# Patient Record
Sex: Female | Born: 1965 | Race: White | Hispanic: No | Marital: Married | Smoking: Former smoker
Health system: Southern US, Community
[De-identification: ages and names within clinical notes are randomized; demographics above are authoritative.]

---

## 2016-05-19 DIAGNOSIS — M9901 Segmental and somatic dysfunction of cervical region: Secondary | ICD-10-CM | POA: Diagnosis not present

## 2016-05-19 DIAGNOSIS — M9902 Segmental and somatic dysfunction of thoracic region: Secondary | ICD-10-CM | POA: Diagnosis not present

## 2016-05-19 DIAGNOSIS — M5033 Other cervical disc degeneration, cervicothoracic region: Secondary | ICD-10-CM | POA: Diagnosis not present

## 2016-05-19 DIAGNOSIS — M531 Cervicobrachial syndrome: Secondary | ICD-10-CM | POA: Diagnosis not present

## 2016-05-20 DIAGNOSIS — M9901 Segmental and somatic dysfunction of cervical region: Secondary | ICD-10-CM | POA: Diagnosis not present

## 2016-05-20 DIAGNOSIS — M531 Cervicobrachial syndrome: Secondary | ICD-10-CM | POA: Diagnosis not present

## 2016-05-20 DIAGNOSIS — M9902 Segmental and somatic dysfunction of thoracic region: Secondary | ICD-10-CM | POA: Diagnosis not present

## 2016-05-20 DIAGNOSIS — M5033 Other cervical disc degeneration, cervicothoracic region: Secondary | ICD-10-CM | POA: Diagnosis not present

## 2016-05-23 DIAGNOSIS — M531 Cervicobrachial syndrome: Secondary | ICD-10-CM | POA: Diagnosis not present

## 2016-05-23 DIAGNOSIS — M9902 Segmental and somatic dysfunction of thoracic region: Secondary | ICD-10-CM | POA: Diagnosis not present

## 2016-05-23 DIAGNOSIS — M5033 Other cervical disc degeneration, cervicothoracic region: Secondary | ICD-10-CM | POA: Diagnosis not present

## 2016-05-23 DIAGNOSIS — M9901 Segmental and somatic dysfunction of cervical region: Secondary | ICD-10-CM | POA: Diagnosis not present

## 2016-05-25 DIAGNOSIS — M9902 Segmental and somatic dysfunction of thoracic region: Secondary | ICD-10-CM | POA: Diagnosis not present

## 2016-05-25 DIAGNOSIS — M5033 Other cervical disc degeneration, cervicothoracic region: Secondary | ICD-10-CM | POA: Diagnosis not present

## 2016-05-25 DIAGNOSIS — M531 Cervicobrachial syndrome: Secondary | ICD-10-CM | POA: Diagnosis not present

## 2016-05-25 DIAGNOSIS — M9901 Segmental and somatic dysfunction of cervical region: Secondary | ICD-10-CM | POA: Diagnosis not present

## 2016-05-26 DIAGNOSIS — M9902 Segmental and somatic dysfunction of thoracic region: Secondary | ICD-10-CM | POA: Diagnosis not present

## 2016-05-26 DIAGNOSIS — M5033 Other cervical disc degeneration, cervicothoracic region: Secondary | ICD-10-CM | POA: Diagnosis not present

## 2016-05-26 DIAGNOSIS — M531 Cervicobrachial syndrome: Secondary | ICD-10-CM | POA: Diagnosis not present

## 2016-05-26 DIAGNOSIS — M9901 Segmental and somatic dysfunction of cervical region: Secondary | ICD-10-CM | POA: Diagnosis not present

## 2016-05-30 DIAGNOSIS — M5033 Other cervical disc degeneration, cervicothoracic region: Secondary | ICD-10-CM | POA: Diagnosis not present

## 2016-05-30 DIAGNOSIS — M531 Cervicobrachial syndrome: Secondary | ICD-10-CM | POA: Diagnosis not present

## 2016-05-30 DIAGNOSIS — M9902 Segmental and somatic dysfunction of thoracic region: Secondary | ICD-10-CM | POA: Diagnosis not present

## 2016-05-30 DIAGNOSIS — M9901 Segmental and somatic dysfunction of cervical region: Secondary | ICD-10-CM | POA: Diagnosis not present

## 2016-08-23 ENCOUNTER — Other Ambulatory Visit: Payer: Self-pay | Admitting: Internal Medicine

## 2016-08-23 DIAGNOSIS — E559 Vitamin D deficiency, unspecified: Secondary | ICD-10-CM | POA: Diagnosis not present

## 2016-08-23 DIAGNOSIS — Z23 Encounter for immunization: Secondary | ICD-10-CM | POA: Diagnosis not present

## 2016-08-23 DIAGNOSIS — E7800 Pure hypercholesterolemia, unspecified: Secondary | ICD-10-CM | POA: Diagnosis not present

## 2016-08-23 DIAGNOSIS — M5030 Other cervical disc degeneration, unspecified cervical region: Secondary | ICD-10-CM | POA: Diagnosis not present

## 2016-08-23 DIAGNOSIS — Z119 Encounter for screening for infectious and parasitic diseases, unspecified: Secondary | ICD-10-CM | POA: Diagnosis not present

## 2016-08-26 ENCOUNTER — Other Ambulatory Visit: Payer: Self-pay | Admitting: Internal Medicine

## 2016-08-26 DIAGNOSIS — Z1231 Encounter for screening mammogram for malignant neoplasm of breast: Secondary | ICD-10-CM

## 2016-09-01 ENCOUNTER — Ambulatory Visit
Admission: RE | Admit: 2016-09-01 | Discharge: 2016-09-01 | Disposition: A | Discharge status: Home / Self Care | Payer: BLUE CROSS/BLUE SHIELD | Source: Ambulatory Visit | Attending: Internal Medicine | Admitting: Internal Medicine

## 2016-09-01 DIAGNOSIS — Z1231 Encounter for screening mammogram for malignant neoplasm of breast: Secondary | ICD-10-CM

## 2016-09-19 DIAGNOSIS — E559 Vitamin D deficiency, unspecified: Secondary | ICD-10-CM | POA: Diagnosis not present

## 2016-09-19 DIAGNOSIS — E7800 Pure hypercholesterolemia, unspecified: Secondary | ICD-10-CM | POA: Diagnosis not present

## 2016-09-19 DIAGNOSIS — N6011 Diffuse cystic mastopathy of right breast: Secondary | ICD-10-CM | POA: Diagnosis not present

## 2016-09-19 DIAGNOSIS — Z0001 Encounter for general adult medical examination with abnormal findings: Secondary | ICD-10-CM | POA: Diagnosis not present

## 2016-09-20 ENCOUNTER — Other Ambulatory Visit: Payer: Self-pay | Admitting: Obstetrics & Gynecology

## 2016-09-20 ENCOUNTER — Other Ambulatory Visit (HOSPITAL_COMMUNITY)
Admission: RE | Admit: 2016-09-20 | Discharge: 2016-09-20 | Disposition: A | Discharge status: Home / Self Care | Payer: BLUE CROSS/BLUE SHIELD | Source: Ambulatory Visit | Attending: Obstetrics & Gynecology | Admitting: Obstetrics & Gynecology

## 2016-09-20 DIAGNOSIS — Z01419 Encounter for gynecological examination (general) (routine) without abnormal findings: Secondary | ICD-10-CM | POA: Diagnosis not present

## 2016-09-20 DIAGNOSIS — Z1151 Encounter for screening for human papillomavirus (HPV): Secondary | ICD-10-CM | POA: Diagnosis not present

## 2016-09-23 LAB — CYTOLOGY - PAP
DIAGNOSIS: NEGATIVE
HPV: NOT DETECTED

## 2016-12-06 DIAGNOSIS — M545 Low back pain: Secondary | ICD-10-CM | POA: Diagnosis not present

## 2016-12-12 DIAGNOSIS — M25552 Pain in left hip: Secondary | ICD-10-CM | POA: Diagnosis not present

## 2016-12-12 DIAGNOSIS — M545 Low back pain: Secondary | ICD-10-CM | POA: Diagnosis not present

## 2016-12-26 DIAGNOSIS — M545 Low back pain: Secondary | ICD-10-CM | POA: Diagnosis not present

## 2016-12-26 DIAGNOSIS — M79601 Pain in right arm: Secondary | ICD-10-CM | POA: Diagnosis not present

## 2017-01-03 DIAGNOSIS — M545 Low back pain: Secondary | ICD-10-CM | POA: Diagnosis not present

## 2017-01-03 DIAGNOSIS — M79601 Pain in right arm: Secondary | ICD-10-CM | POA: Diagnosis not present

## 2017-01-24 DIAGNOSIS — M79601 Pain in right arm: Secondary | ICD-10-CM | POA: Diagnosis not present

## 2017-01-24 DIAGNOSIS — M545 Low back pain: Secondary | ICD-10-CM | POA: Diagnosis not present

## 2017-02-02 DIAGNOSIS — M79601 Pain in right arm: Secondary | ICD-10-CM | POA: Diagnosis not present

## 2017-02-02 DIAGNOSIS — M545 Low back pain: Secondary | ICD-10-CM | POA: Diagnosis not present

## 2017-02-13 DIAGNOSIS — M79601 Pain in right arm: Secondary | ICD-10-CM | POA: Diagnosis not present

## 2017-02-13 DIAGNOSIS — M545 Low back pain: Secondary | ICD-10-CM | POA: Diagnosis not present

## 2017-02-27 DIAGNOSIS — M79601 Pain in right arm: Secondary | ICD-10-CM | POA: Diagnosis not present

## 2017-02-27 DIAGNOSIS — M545 Low back pain: Secondary | ICD-10-CM | POA: Diagnosis not present

## 2017-03-13 DIAGNOSIS — M79601 Pain in right arm: Secondary | ICD-10-CM | POA: Diagnosis not present

## 2017-03-13 DIAGNOSIS — M545 Low back pain: Secondary | ICD-10-CM | POA: Diagnosis not present

## 2017-03-21 DIAGNOSIS — M79601 Pain in right arm: Secondary | ICD-10-CM | POA: Diagnosis not present

## 2017-03-21 DIAGNOSIS — M545 Low back pain: Secondary | ICD-10-CM | POA: Diagnosis not present

## 2017-03-27 DIAGNOSIS — M545 Low back pain: Secondary | ICD-10-CM | POA: Diagnosis not present

## 2017-03-27 DIAGNOSIS — M79601 Pain in right arm: Secondary | ICD-10-CM | POA: Diagnosis not present

## 2017-04-11 DIAGNOSIS — M545 Low back pain: Secondary | ICD-10-CM | POA: Diagnosis not present

## 2017-04-11 DIAGNOSIS — M79601 Pain in right arm: Secondary | ICD-10-CM | POA: Diagnosis not present

## 2017-08-13 DIAGNOSIS — J3089 Other allergic rhinitis: Secondary | ICD-10-CM | POA: Diagnosis not present

## 2017-08-13 DIAGNOSIS — R0982 Postnasal drip: Secondary | ICD-10-CM | POA: Diagnosis not present

## 2017-08-13 DIAGNOSIS — J069 Acute upper respiratory infection, unspecified: Secondary | ICD-10-CM | POA: Diagnosis not present

## 2017-08-13 DIAGNOSIS — J019 Acute sinusitis, unspecified: Secondary | ICD-10-CM | POA: Diagnosis not present

## 2017-09-21 ENCOUNTER — Other Ambulatory Visit: Payer: Self-pay | Admitting: Internal Medicine

## 2017-09-21 DIAGNOSIS — R1013 Epigastric pain: Secondary | ICD-10-CM

## 2017-09-21 DIAGNOSIS — Z01419 Encounter for gynecological examination (general) (routine) without abnormal findings: Secondary | ICD-10-CM | POA: Diagnosis not present

## 2017-09-21 DIAGNOSIS — Z119 Encounter for screening for infectious and parasitic diseases, unspecified: Secondary | ICD-10-CM | POA: Diagnosis not present

## 2017-09-21 DIAGNOSIS — Z1231 Encounter for screening mammogram for malignant neoplasm of breast: Secondary | ICD-10-CM

## 2017-09-27 ENCOUNTER — Ambulatory Visit
Admission: RE | Admit: 2017-09-27 | Discharge: 2017-09-27 | Disposition: A | Discharge status: Home / Self Care | Payer: BLUE CROSS/BLUE SHIELD | Source: Ambulatory Visit | Attending: Internal Medicine | Admitting: Internal Medicine

## 2017-09-27 DIAGNOSIS — R1011 Right upper quadrant pain: Secondary | ICD-10-CM | POA: Diagnosis not present

## 2017-09-27 DIAGNOSIS — R1013 Epigastric pain: Secondary | ICD-10-CM

## 2017-10-16 ENCOUNTER — Ambulatory Visit
Admission: RE | Admit: 2017-10-16 | Discharge: 2017-10-16 | Disposition: A | Discharge status: Home / Self Care | Payer: BLUE CROSS/BLUE SHIELD | Source: Ambulatory Visit | Attending: Internal Medicine | Admitting: Internal Medicine

## 2017-10-16 DIAGNOSIS — Z1231 Encounter for screening mammogram for malignant neoplasm of breast: Secondary | ICD-10-CM

## 2017-12-01 DIAGNOSIS — Z23 Encounter for immunization: Secondary | ICD-10-CM | POA: Diagnosis not present

## 2018-06-24 ENCOUNTER — Encounter (HOSPITAL_BASED_OUTPATIENT_CLINIC_OR_DEPARTMENT_OTHER): Payer: Self-pay | Admitting: Emergency Medicine

## 2018-06-24 ENCOUNTER — Other Ambulatory Visit: Payer: Self-pay

## 2018-06-24 ENCOUNTER — Emergency Department (HOSPITAL_BASED_OUTPATIENT_CLINIC_OR_DEPARTMENT_OTHER)
Admission: EM | Admit: 2018-06-24 | Discharge: 2018-06-24 | Disposition: A | Discharge status: Home / Self Care | Payer: BLUE CROSS/BLUE SHIELD | Attending: Emergency Medicine | Admitting: Emergency Medicine

## 2018-06-24 ENCOUNTER — Emergency Department (HOSPITAL_BASED_OUTPATIENT_CLINIC_OR_DEPARTMENT_OTHER): Payer: BLUE CROSS/BLUE SHIELD

## 2018-06-24 DIAGNOSIS — Y929 Unspecified place or not applicable: Secondary | ICD-10-CM | POA: Diagnosis not present

## 2018-06-24 DIAGNOSIS — M542 Cervicalgia: Secondary | ICD-10-CM | POA: Insufficient documentation

## 2018-06-24 DIAGNOSIS — S199XXA Unspecified injury of neck, initial encounter: Secondary | ICD-10-CM | POA: Diagnosis not present

## 2018-06-24 DIAGNOSIS — R51 Headache: Secondary | ICD-10-CM

## 2018-06-24 DIAGNOSIS — W19XXXA Unspecified fall, initial encounter: Secondary | ICD-10-CM

## 2018-06-24 DIAGNOSIS — S01511A Laceration without foreign body of lip, initial encounter: Secondary | ICD-10-CM | POA: Diagnosis not present

## 2018-06-24 DIAGNOSIS — Y93E1 Activity, personal bathing and showering: Secondary | ICD-10-CM | POA: Insufficient documentation

## 2018-06-24 DIAGNOSIS — S0093XA Contusion of unspecified part of head, initial encounter: Secondary | ICD-10-CM | POA: Insufficient documentation

## 2018-06-24 DIAGNOSIS — Y999 Unspecified external cause status: Secondary | ICD-10-CM | POA: Insufficient documentation

## 2018-06-24 DIAGNOSIS — S0993XA Unspecified injury of face, initial encounter: Secondary | ICD-10-CM | POA: Diagnosis not present

## 2018-06-24 DIAGNOSIS — S0990XA Unspecified injury of head, initial encounter: Secondary | ICD-10-CM | POA: Diagnosis not present

## 2018-06-24 DIAGNOSIS — R220 Localized swelling, mass and lump, head: Secondary | ICD-10-CM | POA: Diagnosis not present

## 2018-06-24 DIAGNOSIS — W182XXA Fall in (into) shower or empty bathtub, initial encounter: Secondary | ICD-10-CM | POA: Insufficient documentation

## 2018-06-24 DIAGNOSIS — R519 Headache, unspecified: Secondary | ICD-10-CM

## 2018-06-24 MED ORDER — DIPHENHYDRAMINE HCL 50 MG/ML IJ SOLN
25.0000 mg | Freq: Once | INTRAMUSCULAR | Status: AC
  Administered 2018-06-24: 25 mg via INTRAVENOUS
  Filled 2018-06-24: qty 1

## 2018-06-24 MED ORDER — PROCHLORPERAZINE EDISYLATE 10 MG/2ML IJ SOLN
10.0000 mg | Freq: Once | INTRAMUSCULAR | Status: AC
  Administered 2018-06-24: 10 mg via INTRAVENOUS
  Filled 2018-06-24: qty 2

## 2018-06-24 MED ORDER — IBUPROFEN 800 MG PO TABS
800.0000 mg | ORAL_TABLET | Freq: Once | ORAL | Status: DC
  Filled 2018-06-24: qty 1

## 2018-06-24 MED ORDER — KETOROLAC TROMETHAMINE 30 MG/ML IJ SOLN
30.0000 mg | Freq: Once | INTRAMUSCULAR | Status: AC
  Administered 2018-06-24: 30 mg via INTRAVENOUS
  Filled 2018-06-24: qty 1

## 2018-06-24 MED ORDER — PROCHLORPERAZINE MALEATE 10 MG PO TABS: 10.0000 mg | ORAL_TABLET | Freq: Two times a day (BID) | ORAL | 0 refills | Status: DC | PRN

## 2018-06-24 NOTE — Discharge Instructions (Signed)
Your imaging today was overall reassuring.  You had some facial swelling where you hit your face.  We did not find evidence of fracture or dislocation or intracranial hemorrhage.  We suspect he may have a mild concussion.  Please take the medicine to help with your nausea and stay hydrated.  Please rest.  If any symptoms change or worsen, please return to the nearest emergency department.

## 2018-06-24 NOTE — ED Triage Notes (Addendum)
Larey SeatFell getting out of the  shower and hit left side of face on floor. No LOC, swelling to left side of face

## 2018-06-24 NOTE — ED Provider Notes (Signed)
[facility] EMERGENCY DEPARTMENT Provider Note   CSN: [id number] Arrival date & time: 06/24/18  0906     History   Chief Complaint Chief Complaint  Patient presents with  . Fall    HPI Rami Waddle is a 52 y.o. female.  The history is provided by the patient and the spouse.  Fall  This is a new problem. The current episode started 1 to 2 hours ago. The problem occurs constantly. The problem has not changed since onset.Associated symptoms include headaches. Pertinent negatives include no chest pain, no abdominal pain and no shortness of breath. Nothing aggravates the symptoms. Nothing relieves the symptoms. She has tried a cold compress for the symptoms. The treatment provided mild relief.    History reviewed. No pertinent past medical history.  There are no active problems to display for this patient.   History reviewed. No pertinent surgical history.   OB History   None      Home Medications    Prior to Admission medications   Not on File    Family History Family History  Problem Relation Age of Onset  . Breast cancer Neg Hx     Social History Social History   Tobacco Use  . Smoking status: Never Smoker  . Smokeless tobacco: Never Used  Substance Use Topics  . Alcohol use: Yes    Comment: social  . Drug use: Never     Allergies   Augmentin [amoxicillin-pot clavulanate]   Review of Systems Review of Systems  Constitutional: Negative for activity change, chills, diaphoresis, fatigue and fever.  HENT: Negative for congestion and rhinorrhea.   Eyes: Negative for visual disturbance.  Respiratory: Negative for cough, chest tightness, shortness of breath and stridor.   Cardiovascular: Negative for chest pain, palpitations and leg swelling.  Gastrointestinal: Negative for abdominal distention, abdominal pain, constipation, diarrhea, nausea and vomiting.  Genitourinary: Negative for difficulty urinating, dysuria, flank pain, frequency,  hematuria, menstrual problem, pelvic pain, vaginal bleeding and vaginal discharge.  Musculoskeletal: Positive for neck pain. Negative for back pain and neck stiffness.  Skin: Negative for rash and wound.  Neurological: Positive for headaches. Negative for dizziness, tremors, seizures, facial asymmetry, speech difficulty, weakness, light-headedness and numbness.  Psychiatric/Behavioral: Negative for agitation and confusion.  All other systems reviewed and are negative.    Physical Exam Updated Vital Signs BP 125/82 (BP Location: Right Arm)   Pulse 82   Temp 98 F (36.7 C) (Oral)   Resp 16   Ht 5\' 8"  (1.727 m)   Wt 72.6 kg (160 lb)   LMP 05/31/2018   SpO2 99%   BMI 24.33 kg/m   Physical Exam  Constitutional: She is oriented to person, place, and time. She appears well-developed and well-nourished. No distress.  HENT:  Head: Normocephalic. Head is with contusion and with laceration. Head is without abrasion.    Right Ear: External ear normal.  Left Ear: External ear normal.  Nose: Nose normal.  Mouth/Throat: Uvula is midline and oropharynx is clear and moist. She does not have dentures. No oral lesions. No trismus in the jaw. Normal dentition. No uvula swelling. No oropharyngeal exudate.    Eyes: Pupils are equal, round, and reactive to light. Conjunctivae and EOM are normal.  Neck: Normal range of motion. Neck supple. Muscular tenderness present. No spinous process tenderness present. No neck rigidity. Normal range of motion present.    Cardiovascular: Normal rate and intact distal pulses.  No murmur heard. Pulmonary/Chest: Effort normal. No stridor. No  respiratory distress. She exhibits no tenderness.  Abdominal: She exhibits no distension. There is no tenderness. There is no rebound.  Musculoskeletal: She exhibits tenderness. She exhibits no edema.  Neurological: She is alert and oriented to person, place, and time. She has normal reflexes. She is not disoriented. She  displays no tremor. No cranial nerve deficit or sensory deficit. She exhibits normal muscle tone. Coordination normal.  No focal neuro abnormalities.   Skin: Skin is warm. Capillary refill takes less than 2 seconds. No rash noted. She is not diaphoretic. No erythema.  Psychiatric: She has a normal mood and affect.  Nursing note and vitals reviewed.    ED Treatments / Results  Labs (all labs ordered are listed, but only abnormal results are displayed) Labs Reviewed - No data to display  EKG None  Radiology Ct Head Wo Contrast  Result Date: 06/24/2018 CLINICAL DATA:  52 year old female with acute head, face and neck injury following fall today. Headache, facial pain and neck pain. Initial encounter. EXAM: CT HEAD WITHOUT CONTRAST CT MAXILLOFACIAL WITHOUT CONTRAST CT CERVICAL SPINE WITHOUT CONTRAST TECHNIQUE: Multidetector CT imaging of the head, cervical spine, and maxillofacial structures were performed using the standard protocol without intravenous contrast. Multiplanar CT image reconstructions of the cervical spine and maxillofacial structures were also generated. COMPARISON:  None. FINDINGS: CT HEAD FINDINGS Brain: No evidence of acute infarction, hemorrhage, hydrocephalus, extra-axial collection or mass lesion/mass effect. Vascular: No hyperdense vessel or unexpected calcification. Skull: Normal. Negative for fracture or focal lesion. Other: None. CT MAXILLOFACIAL FINDINGS Osseous: No fracture or mandibular dislocation. No destructive process. Orbits: Negative. No traumatic or inflammatory finding. Sinuses: Clear. Soft tissues: Mild LEFT facial soft tissue swelling noted. CT CERVICAL SPINE FINDINGS Alignment: Normal. Skull base and vertebrae: No acute fracture. No primary bone lesion or focal pathologic process. Soft tissues and spinal canal: No prevertebral fluid or swelling. No visible canal hematoma. Disc levels:  Unremarkable Upper chest: Negative. Other: None IMPRESSION: 1. Unremarkable  noncontrast head and cervical spine CTs. 2. LEFT facial soft tissue swelling without acute bony abnormality. Electronically Signed   By: Harmon Pier M.D.   On: 06/24/2018 10:51   Ct Cervical Spine Wo Contrast  Result Date: 06/24/2018 CLINICAL DATA:  52 year old female with acute head, face and neck injury following fall today. Headache, facial pain and neck pain. Initial encounter. EXAM: CT HEAD WITHOUT CONTRAST CT MAXILLOFACIAL WITHOUT CONTRAST CT CERVICAL SPINE WITHOUT CONTRAST TECHNIQUE: Multidetector CT imaging of the head, cervical spine, and maxillofacial structures were performed using the standard protocol without intravenous contrast. Multiplanar CT image reconstructions of the cervical spine and maxillofacial structures were also generated. COMPARISON:  None. FINDINGS: CT HEAD FINDINGS Brain: No evidence of acute infarction, hemorrhage, hydrocephalus, extra-axial collection or mass lesion/mass effect. Vascular: No hyperdense vessel or unexpected calcification. Skull: Normal. Negative for fracture or focal lesion. Other: None. CT MAXILLOFACIAL FINDINGS Osseous: No fracture or mandibular dislocation. No destructive process. Orbits: Negative. No traumatic or inflammatory finding. Sinuses: Clear. Soft tissues: Mild LEFT facial soft tissue swelling noted. CT CERVICAL SPINE FINDINGS Alignment: Normal. Skull base and vertebrae: No acute fracture. No primary bone lesion or focal pathologic process. Soft tissues and spinal canal: No prevertebral fluid or swelling. No visible canal hematoma. Disc levels:  Unremarkable Upper chest: Negative. Other: None IMPRESSION: 1. Unremarkable noncontrast head and cervical spine CTs. 2. LEFT facial soft tissue swelling without acute bony abnormality. Electronically Signed   By: Harmon Pier M.D.   On: 06/24/2018 10:51   Ct Maxillofacial  Wo Contrast  Result Date: 06/24/2018 CLINICAL DATA:  52 year old female with acute head, face and neck injury following fall today.  Headache, facial pain and neck pain. Initial encounter. EXAM: CT HEAD WITHOUT CONTRAST CT MAXILLOFACIAL WITHOUT CONTRAST CT CERVICAL SPINE WITHOUT CONTRAST TECHNIQUE: Multidetector CT imaging of the head, cervical spine, and maxillofacial structures were performed using the standard protocol without intravenous contrast. Multiplanar CT image reconstructions of the cervical spine and maxillofacial structures were also generated. COMPARISON:  None. FINDINGS: CT HEAD FINDINGS Brain: No evidence of acute infarction, hemorrhage, hydrocephalus, extra-axial collection or mass lesion/mass effect. Vascular: No hyperdense vessel or unexpected calcification. Skull: Normal. Negative for fracture or focal lesion. Other: None. CT MAXILLOFACIAL FINDINGS Osseous: No fracture or mandibular dislocation. No destructive process. Orbits: Negative. No traumatic or inflammatory finding. Sinuses: Clear. Soft tissues: Mild LEFT facial soft tissue swelling noted. CT CERVICAL SPINE FINDINGS Alignment: Normal. Skull base and vertebrae: No acute fracture. No primary bone lesion or focal pathologic process. Soft tissues and spinal canal: No prevertebral fluid or swelling. No visible canal hematoma. Disc levels:  Unremarkable Upper chest: Negative. Other: None IMPRESSION: 1. Unremarkable noncontrast head and cervical spine CTs. 2. LEFT facial soft tissue swelling without acute bony abnormality. Electronically Signed   By: Harmon PierJeffrey  Hu M.D.   On: 06/24/2018 10:51    Procedures Procedures (including critical care time)  Medications Ordered in ED Medications  ibuprofen (ADVIL,MOTRIN) tablet 800 mg (800 mg Oral Refused 06/24/18 1132)  prochlorperazine (COMPAZINE) injection 10 mg (10 mg Intravenous Given 06/24/18 1143)  diphenhydrAMINE (BENADRYL) injection 25 mg (25 mg Intravenous Given 06/24/18 1143)  ketorolac (TORADOL) 30 MG/ML injection 30 mg (30 mg Intravenous Given 06/24/18 1143)     Initial Impression / Assessment and Plan / ED Course   I have reviewed the triage vital signs and the nursing notes.  Pertinent labs & imaging results that were available during my care of the patient were reviewed by me and considered in my medical decision making (see chart for details).     Sigmund Hazelaige Denne is a 52 y.o. female with no significant past medical history who presents for a fall with left facial injury.  Patient reports that she slipped getting out of the shower today while chasing a dog and a cat landing on her left face onto tile.  She reports no loss of consciousness but felt dazed.  She reports she was somewhat unsteady after her fall.  She reports no nausea or vomiting but reports left-sided headache and pain.  Patient also reports pain in her neck.  She denies chest pain, shortness of breath, nausea, vomiting.  She denies other complaints.  She denies any pain with eye movement or vision changes.  On exam, patient has a small abrasion/laceration to her left inner lip.  No evidence of vermilion border injury.  Wound is small and do not feel needs laceration repair.  Patient had tenderness in the left face.  No hemotympanum and no nasal septal hematoma was seen.  Normal extraocular movements and pupils reactive bilaterally.  Neck had some tenderness in the left paraspinal area but no midline tenderness.  Lungs were clear and chest was nontender.  Patient had full range of motion of upper extremities including her left shoulder which was slightly painful.  No focal tenderness.  Doubt fracture.    Patient had CT of the head, face, and neck to look for traumatic injuries.  Soft tissue injury was seen but no evidence of intracranial hemorrhage or fractures.  Patient will be given a headache cocktail to help with her symptoms and patient will likely be discharged home for outpatient management of her mild concussion and soft tissue injury.    Anticipate follow-up with PCP in several days for reassessment.  Patient reports her headache is nearly  resolved after the medicine.  Patient feels much better and feel safe going home.  Patient will follow-up with PCP and understood return precautions.  Patient can prescription for Compazine to help maintain her nausea.  Patient denied other questions or concerns and was discharged in good condition.    Final Clinical Impressions(s) / ED Diagnoses   Final diagnoses:  Fall, initial encounter  Facial pain, acute    ED Discharge Orders        Ordered    prochlorperazine (COMPAZINE) 10 MG tablet  2 times daily PRN,   Status:  Discontinued     06/24/18 1222    prochlorperazine (COMPAZINE) 10 MG tablet  2 times daily PRN     06/24/18 1223     Clinical Impression: 1. Fall, initial encounter   2. Facial pain, acute     Disposition: Discharge  Condition: Good  I have discussed the results, Dx and Tx plan with the pt(& family if present). He/she/they expressed understanding and agree(s) with the plan. Discharge instructions discussed at great length. Strict return precautions discussed and pt &/or family have verbalized understanding of the instructions. No further questions at time of discharge.    Current Discharge Medication List    START taking these medications   Details  prochlorperazine (COMPAZINE) 10 MG tablet Take 1 tablet (10 mg total) by mouth 2 (two) times daily as needed for nausea or vomiting. Qty: 10 tablet, Refills: 0        Follow Up: Kendrick Ranch, MD [address] [place] [address] [place] [address] [phone]     [facility] [place]PARTMENT [address] [id number] [address] [place] [address] [phone]       Roise Emert, Canary Brim, MD 06/24/18 Norberta Keens

## 2018-09-24 DIAGNOSIS — Z23 Encounter for immunization: Secondary | ICD-10-CM | POA: Diagnosis not present

## 2018-09-24 DIAGNOSIS — E7800 Pure hypercholesterolemia, unspecified: Secondary | ICD-10-CM | POA: Diagnosis not present

## 2018-09-24 DIAGNOSIS — Z0000 Encounter for general adult medical examination without abnormal findings: Secondary | ICD-10-CM | POA: Diagnosis not present

## 2018-09-25 ENCOUNTER — Other Ambulatory Visit: Payer: Self-pay | Admitting: Obstetrics & Gynecology

## 2018-09-25 DIAGNOSIS — Z01411 Encounter for gynecological examination (general) (routine) with abnormal findings: Secondary | ICD-10-CM | POA: Diagnosis not present

## 2018-09-25 DIAGNOSIS — N939 Abnormal uterine and vaginal bleeding, unspecified: Secondary | ICD-10-CM | POA: Diagnosis not present

## 2018-09-25 DIAGNOSIS — Z3202 Encounter for pregnancy test, result negative: Secondary | ICD-10-CM | POA: Diagnosis not present

## 2018-10-01 DIAGNOSIS — M7502 Adhesive capsulitis of left shoulder: Secondary | ICD-10-CM | POA: Diagnosis not present

## 2018-10-03 DIAGNOSIS — M7502 Adhesive capsulitis of left shoulder: Secondary | ICD-10-CM | POA: Diagnosis not present

## 2018-10-04 DIAGNOSIS — M7502 Adhesive capsulitis of left shoulder: Secondary | ICD-10-CM | POA: Diagnosis not present

## 2018-10-11 DIAGNOSIS — M7502 Adhesive capsulitis of left shoulder: Secondary | ICD-10-CM | POA: Diagnosis not present

## 2018-10-15 DIAGNOSIS — N840 Polyp of corpus uteri: Secondary | ICD-10-CM | POA: Diagnosis not present

## 2018-10-15 DIAGNOSIS — Z01818 Encounter for other preprocedural examination: Secondary | ICD-10-CM | POA: Diagnosis not present

## 2018-10-19 DIAGNOSIS — N939 Abnormal uterine and vaginal bleeding, unspecified: Secondary | ICD-10-CM | POA: Diagnosis not present

## 2018-10-19 DIAGNOSIS — Z01818 Encounter for other preprocedural examination: Secondary | ICD-10-CM | POA: Diagnosis not present

## 2018-10-19 DIAGNOSIS — N840 Polyp of corpus uteri: Secondary | ICD-10-CM | POA: Diagnosis not present

## 2018-10-19 NOTE — H&P (Signed)
52yo G0 who presents for [facility], D&C, Myosure polypectomy due to AUB and uterine polyp- 12/6. In reivew: 1) AUB: Over the past year her periods have started to become more frequent. On occasion, she will have 2 periods in one month. It will be about every 2 weeks- menses are the same- about 3-5 days of moderate bleeding using about 4 pads per day. The bleeding is sporadic and never knows which months this is going to happen and which months it does not. Today she notes that she had a "normal" period 11/11 and so far no further bleeding. No other acute changes. No vaginal discharge, itching or irritation. No pelvic or abdominal pain. Denies significant pelvic pain. Denies hot flashes or night sweats. No other acute complaints 10/29: TVUS/SHG: 8.5cm anteverted uterus- thickened endometrium. With SHG 1cm hyperechoic mass see ant wall- mid to LUS- avascular. Enodmetrial walls thin. Normal ovaries bilaterally.   Current Medications  Taking   Ibuprofen 400 MG Tablet 1 tablet with food or milk as needed Orally Three times a day, Notes: PRN   Advil Allergy Sinus(Chlorpheniramine-PSE-Ibuprofen) 2-30-200 MG Tablet 1 tablet as needed Orally every 6 hrs, Notes: PRN   Gas-X(Simethicone) 80 MG Tablet Chewable 1 tablet after meals and at bedtime as needed Orally Four times a day, Notes: PRN   Cod Liver Oil - Oil 1 tsp Orally   MVI Ritual 1 tab daily   Medication List reviewed and reconciled with the patient    Past Medical History  DJD (degenerative joint disease), cervical.   Vitamin D deficiency.   Benign paroxysmal positional vertigo, unspecified laterality Post head trauma after falling in 2014.   Fibrocystic changes of right breast.           Surgical History  No Surgical History documented.   Family History  Father: alive, heart disease, COPD,, emphysema-smoker, high cholesterol  Mother: alive, high cholesterol, asthma-cholrine posion in lab  Paternal [place] Father: deceased  Paternal Grand  Mother: deceased, heart issues in late 77's-bypass at 17  Maternal [place] Father: deceased, smoker, died of lung related issues  Maternal Grand Mother: deceased, multiple myleloma  Maternal aunt: deceased, died in 10's breast cancer  Great Aunt Breast CA GGM DM no h/o colon CA  only child.   Social History  General:  Alcohol: yes, 1-2 per week.  Children: none.  Caffeine: yes, coffee, tea, 2+ servings daily.  Tobacco use  cigarettes: Former smoker Quit in year 2014 Pack-year Hx: 22 socially about 1/2 ppd at most Tobacco history last updated 10/15/2018 Marital Status: married.  OCCUPATION: employed, Glass blower/designer.    Gyn History  Sexual activity currently sexually active.  Periods : regular.  LMP 10-08-18.  Denies H/O Birth control.  Last pap smear date 09/20/2016 Negative, HPV-.  Last mammogram date 09-2017.  Denies H/O Abnormal pap smear.  Denies H/O STD.  Menarche 53.    OB History  Never been pregnant per patient.    Allergies  Birth Control Pills: hives  Augmentin: Hives   Hospitalization/Major Diagnostic Procedure  No Hospitalization History.   Review of Systems  CONSTITUTIONAL:  no Appetite changes. no Chills. Fatigue yes. no Fever.  CARDIOLOGY:  no Chest pain.  RESPIRATORY:  no Shortness of breath. no Cough.  UROLOGY:  no Dysuria. no Urinary frequency. no Urinary incontinence. no Urinary urgency.  GASTROENTEROLOGY:  no Abdominal pain. no Change in bowel habits. no Change in bowel movements.  FEMALE REPRODUCTIVE:  no Abnormal vaginal discharge. no Breast lumps or discharge. no  Breast pain. no Hot flashes. no Sexual problems. no Vaginal irritation. no Vaginal itching.  NEUROLOGY:  no Dizziness. no Headache.  PSYCHOLOGY:  no Anxiety. no Depression.  SKIN:  no Rash. no Hives.  HEMATOLOGY/LYMPH:  no Anemia. Using Blood Thinners no.     Vital Signs  Wt 163.3, Wt change 2.3 lb, Ht 67.25, BMI 25.38, BP sitting 100/60.   Examination  General  Examination: CONSTITUTIONAL: well nourished, well developed.  SKIN: warm & dry, no rashes.  NECK: supple, normal appearance, thyroid not enlarged.  LUNGS: clear to auscultation bilaterally, no wheezes, rhonchi, rales.  HEART: no murmurs, regular rate and rhythm.  ABDOMEN: no masses palpated, soft and not tender, no rebound, no guarding.  EXTREMITIES: no edema present, no calf tenderness bilaterally.  LYMPH NODES: no inguinal adenopathy, no axillary adenopathy.  PSYCH: oriented x 3, appropriate mood and affect.      LABS: 09/24/2018 CBC with Diff    NAME VALUE REFERENCE RANGE  F WBC 5.3 4.0-11.0 (K/ul)  F RBC 4.47 4.20-5.40 (M/uL)  F HGB 13.7 12.0-16.0 (g/dL)  F HCT 40.7 37.0-47.0 (%)  F MCV 91.2 81.0-99.0 (fL)  F MCH 30.7 27.0-33.0 (pg)  F MCHC 33.7 32.0-36.0 (g/dL)  F RDW 13.8 11.5-15.5 (%)  F PLT 262 150-400 (K/uL)  F MPV 8.0 7.5-10.7 (fL)  F NE% 60.2 43.3-71.9 (%)  F LY% 30.0 16.8-43.5 (%)  F MO% 6.9 4.6-12.4 (%)  F EO% 1.7 0.0-7.8 (%)  F BA% 1.2 H 0.0-1.0 (%)  F NE# 3.2 1.9-7.2 (K/uL)  F LY# 1.60 1.10-2.70 (K/uL)  F MO# 0.4 0.3-0.8 (K/uL)  F EO# 0.1 0.0-0.6 (K/uL)  F BA# 0.1 0.0-0.1 (K/uL)  F NRBC% 0.00 (%)  F NRBC# 9.37    Comp Metabolic Panel    NAME VALUE REFERENCE RANGE  F Glucose 102 H 70-99 (mg/dL)  F BUN 17 6-26 (mg/dL)  F Creatinine 0.67 0.60-1.30 (mg/dl)  F eGFR 93 >60 (calc)  - Stage 1 > 90 ML/Min plus Albuminuria;Stage 2 60-89 ML/MIN;Stage 3 30-59 ML/MIN;Stage 4 15-29 ML/MIN;Stage 5 <15 ML/MIN  F AAeGFR 112 >60 (calc)  - Stage 1 > 90 ML/Min plus Albuminuria;Stage 2 60-89 ML/MIN;Stage 3 30-59 ML/MIN;Stage 4 15-29 ML/MIN;Stage 5 <15 ML/MIN  F Sodium 141 136-145 (mmol/L)  F Potassium 4.1 3.5-5.5 (mmol/L)  F Chloride 105 98-107 (mmol/L)  F CO2 29 22-32 (mmol/L)  F Anion Gap 11.4 6.0-20.0 (mmol/L)  F Calcium 9.6 8.6-10.3 (mg/dL)  F CA-corrected 9.31 8.60-10.30 (mg/dL)  F Protein, Total 6.4 6.0-8.3 (g/dL)  F Albumin 4.3 3.4-4.8 (g/dL)  F TBIL 1.1 H  0.3-1.0 (mg/dL)  F ALP 28 L 38-126 (U/L)  F AST 18 0-39 (U/L)  F ALT 17 0-52 (U/L)   A/P: 52yo who presents for hysteroscopy, D&C, myosure polypectomy due to AUB and uterine polyp -NPO -LR @ 125cc/hr -labs as above -SCDs to OR Risk/benefit and alternatives reviewed, including but not limited to risk of bleeding, infection, uterine perforation and injury to surrounding organs. Questions and concerns were addressed and she desires to proceed  Janyth Pupa, DO [phone] (cell) [phone] (office)

## 2018-10-23 ENCOUNTER — Other Ambulatory Visit: Payer: Self-pay

## 2018-10-23 ENCOUNTER — Encounter (HOSPITAL_BASED_OUTPATIENT_CLINIC_OR_DEPARTMENT_OTHER): Payer: Self-pay | Admitting: *Deleted

## 2018-10-30 DIAGNOSIS — M7502 Adhesive capsulitis of left shoulder: Secondary | ICD-10-CM | POA: Diagnosis not present

## 2018-11-02 ENCOUNTER — Encounter (HOSPITAL_BASED_OUTPATIENT_CLINIC_OR_DEPARTMENT_OTHER)
Admission: RE | Disposition: A | Discharge status: Home / Self Care | Payer: Self-pay | Source: Ambulatory Visit | Attending: Obstetrics & Gynecology

## 2018-11-02 ENCOUNTER — Ambulatory Visit (HOSPITAL_BASED_OUTPATIENT_CLINIC_OR_DEPARTMENT_OTHER)
Admission: RE | Admit: 2018-11-02 | Discharge: 2018-11-02 | Disposition: A | Discharge status: Home / Self Care | Payer: BLUE CROSS/BLUE SHIELD | Source: Ambulatory Visit | Attending: Obstetrics & Gynecology | Admitting: Obstetrics & Gynecology

## 2018-11-02 ENCOUNTER — Encounter (HOSPITAL_BASED_OUTPATIENT_CLINIC_OR_DEPARTMENT_OTHER): Payer: Self-pay | Admitting: Anesthesiology

## 2018-11-02 ENCOUNTER — Ambulatory Visit (HOSPITAL_BASED_OUTPATIENT_CLINIC_OR_DEPARTMENT_OTHER): Payer: BLUE CROSS/BLUE SHIELD | Admitting: Anesthesiology

## 2018-11-02 DIAGNOSIS — Z8249 Family history of ischemic heart disease and other diseases of the circulatory system: Secondary | ICD-10-CM | POA: Insufficient documentation

## 2018-11-02 DIAGNOSIS — N840 Polyp of corpus uteri: Secondary | ICD-10-CM | POA: Insufficient documentation

## 2018-11-02 DIAGNOSIS — Z87891 Personal history of nicotine dependence: Secondary | ICD-10-CM | POA: Insufficient documentation

## 2018-11-02 DIAGNOSIS — Z880 Allergy status to penicillin: Secondary | ICD-10-CM | POA: Insufficient documentation

## 2018-11-02 DIAGNOSIS — Z888 Allergy status to other drugs, medicaments and biological substances status: Secondary | ICD-10-CM | POA: Insufficient documentation

## 2018-11-02 DIAGNOSIS — N939 Abnormal uterine and vaginal bleeding, unspecified: Secondary | ICD-10-CM | POA: Insufficient documentation

## 2018-11-02 HISTORY — PX: DILATATION & CURETTAGE/HYSTEROSCOPY WITH MYOSURE: SHX6511

## 2018-11-02 SURGERY — DILATATION & CURETTAGE/HYSTEROSCOPY WITH MYOSURE
Anesthesia: General | Site: Uterus

## 2018-11-02 MED ORDER — SCOPOLAMINE 1 MG/3DAYS TD PT72
MEDICATED_PATCH | TRANSDERMAL | Status: AC
  Filled 2018-11-02: qty 1

## 2018-11-02 MED ORDER — SILVER NITRATE-POT NITRATE 75-25 % EX MISC
CUTANEOUS | Status: AC
  Filled 2018-11-02: qty 1

## 2018-11-02 MED ORDER — LIDOCAINE HCL (CARDIAC) PF 100 MG/5ML IV SOSY
PREFILLED_SYRINGE | INTRAVENOUS | Status: DC | PRN
  Administered 2018-11-02: 60 mg via INTRAVENOUS

## 2018-11-02 MED ORDER — PROMETHAZINE HCL 25 MG/ML IJ SOLN: 6.2500 mg | INTRAMUSCULAR | Status: DC | PRN

## 2018-11-02 MED ORDER — SCOPOLAMINE 1 MG/3DAYS TD PT72: 1.0000 | MEDICATED_PATCH | Freq: Once | TRANSDERMAL | Status: DC | PRN

## 2018-11-02 MED ORDER — FENTANYL CITRATE (PF) 100 MCG/2ML IJ SOLN
INTRAMUSCULAR | Status: AC
  Filled 2018-11-02: qty 2

## 2018-11-02 MED ORDER — DEXAMETHASONE SODIUM PHOSPHATE 10 MG/ML IJ SOLN
INTRAMUSCULAR | Status: AC
  Filled 2018-11-02: qty 1

## 2018-11-02 MED ORDER — EPHEDRINE SULFATE 50 MG/ML IJ SOLN
INTRAMUSCULAR | Status: DC | PRN
  Administered 2018-11-02: 10 mg via INTRAVENOUS

## 2018-11-02 MED ORDER — PROPOFOL 10 MG/ML IV BOLUS
INTRAVENOUS | Status: DC | PRN
  Administered 2018-11-02: 150 mg via INTRAVENOUS

## 2018-11-02 MED ORDER — ONDANSETRON HCL 4 MG/2ML IJ SOLN
INTRAMUSCULAR | Status: AC
  Filled 2018-11-02: qty 2

## 2018-11-02 MED ORDER — MIDAZOLAM HCL 2 MG/2ML IJ SOLN
INTRAMUSCULAR | Status: AC
  Filled 2018-11-02: qty 2

## 2018-11-02 MED ORDER — SODIUM CHLORIDE 0.9 % IR SOLN
Status: DC | PRN
  Administered 2018-11-02: 3000 mL

## 2018-11-02 MED ORDER — PROPOFOL 10 MG/ML IV BOLUS
INTRAVENOUS | Status: AC
  Filled 2018-11-02: qty 20

## 2018-11-02 MED ORDER — FENTANYL CITRATE (PF) 100 MCG/2ML IJ SOLN
25.0000 ug | INTRAMUSCULAR | Status: DC | PRN
  Administered 2018-11-02: 50 ug via INTRAVENOUS

## 2018-11-02 MED ORDER — OXYCODONE HCL 5 MG PO TABS: 5.0000 mg | ORAL_TABLET | Freq: Once | ORAL | Status: DC | PRN

## 2018-11-02 MED ORDER — LACTATED RINGERS IV SOLN
INTRAVENOUS | Status: DC
  Administered 2018-11-02: 12:00:00 via INTRAVENOUS

## 2018-11-02 MED ORDER — LACTATED RINGERS IV SOLN
INTRAVENOUS | Status: DC
  Administered 2018-11-02: 13:00:00 via INTRAVENOUS

## 2018-11-02 MED ORDER — ONDANSETRON HCL 4 MG/2ML IJ SOLN
INTRAMUSCULAR | Status: DC | PRN
  Administered 2018-11-02: 4 mg via INTRAVENOUS

## 2018-11-02 MED ORDER — LIDOCAINE-EPINEPHRINE 1 %-1:100000 IJ SOLN
INTRAMUSCULAR | Status: DC | PRN
  Administered 2018-11-02: 10 mL

## 2018-11-02 MED ORDER — FENTANYL CITRATE (PF) 100 MCG/2ML IJ SOLN
50.0000 ug | INTRAMUSCULAR | Status: DC | PRN
  Administered 2018-11-02: 100 ug via INTRAVENOUS

## 2018-11-02 MED ORDER — MIDAZOLAM HCL 2 MG/2ML IJ SOLN
1.0000 mg | INTRAMUSCULAR | Status: DC | PRN
  Administered 2018-11-02: 2 mg via INTRAVENOUS

## 2018-11-02 MED ORDER — DEXAMETHASONE SODIUM PHOSPHATE 4 MG/ML IJ SOLN
INTRAMUSCULAR | Status: DC | PRN
  Administered 2018-11-02: 10 mg via INTRAVENOUS

## 2018-11-02 MED ORDER — OXYCODONE HCL 5 MG/5ML PO SOLN: 5.0000 mg | Freq: Once | ORAL | Status: DC | PRN

## 2018-11-02 SURGICAL SUPPLY — 20 items
BRIEF STRETCH FOR OB PAD XXL (UNDERPADS AND DIAPERS) ×2 IMPLANT
CANISTER SUCT 3000ML PPV (MISCELLANEOUS) ×2 IMPLANT
CATH ROBINSON RED A/P 16FR (CATHETERS) ×2 IMPLANT
DEVICE MYOSURE LITE (MISCELLANEOUS) IMPLANT
DEVICE MYOSURE REACH (MISCELLANEOUS) IMPLANT
DILATOR CANAL MILEX (MISCELLANEOUS) IMPLANT
GLOVE BIO SURGEON STRL SZ 6.5 (GLOVE) ×2 IMPLANT
GLOVE BIOGEL PI IND STRL 6.5 (GLOVE) ×1 IMPLANT
GLOVE BIOGEL PI IND STRL 7.0 (GLOVE) ×1 IMPLANT
GLOVE BIOGEL PI INDICATOR 6.5 (GLOVE) ×1
GLOVE BIOGEL PI INDICATOR 7.0 (GLOVE) ×1
GLOVE ECLIPSE 6.5 STRL STRAW (GLOVE) ×2 IMPLANT
GOWN STRL REUS W/TWL LRG LVL3 (GOWN DISPOSABLE) ×4 IMPLANT
KIT PROCEDURE FLUENT (KITS) ×2 IMPLANT
PACK VAGINAL MINOR WOMEN LF (CUSTOM PROCEDURE TRAY) ×2 IMPLANT
PAD OB MATERNITY 4.3X12.25 (PERSONAL CARE ITEMS) ×2 IMPLANT
PAD PREP 24X48 CUFFED NSTRL (MISCELLANEOUS) ×2 IMPLANT
SEAL ROD LENS SCOPE MYOSURE (ABLATOR) ×2 IMPLANT
SLEEVE SCD COMPRESS KNEE MED (MISCELLANEOUS) ×2 IMPLANT
TOWEL GREEN STERILE FF (TOWEL DISPOSABLE) ×4 IMPLANT

## 2018-11-02 NOTE — Anesthesia Postprocedure Evaluation (Signed)
Anesthesia Post Note  Patient: Victoria Huerta  Procedure(s) Performed: DILATATION & CURETTAGE/HYSTEROSCOPY WITH MYOSURE (N/A Uterus)     Patient location during evaluation: PACU Anesthesia Type: General Level of consciousness: awake and alert Pain management: pain level controlled Vital Signs Assessment: post-procedure vital signs reviewed and stable Respiratory status: spontaneous breathing, nonlabored ventilation and respiratory function stable Cardiovascular status: blood pressure returned to baseline and stable Postop Assessment: no apparent nausea or vomiting Anesthetic complications: no    Last Vitals:  Vitals:   11/02/18 1345 11/02/18 1400  BP: (!) 98/56 106/64  Pulse: 65 72  Resp: 16 18  Temp:    SpO2: 100% 100%    Last Pain:  Vitals:   11/02/18 1345  TempSrc:   PainSc: 0-No pain                 Beryle Lathehomas E Gurnoor Sloop

## 2018-11-02 NOTE — Anesthesia Preprocedure Evaluation (Addendum)
Anesthesia Evaluation  Patient identified by MRN, date of birth, ID band Patient awake    Reviewed: Allergy & Precautions, NPO status , Patient's Chart, lab work & pertinent test results  History of Anesthesia Complications Negative for: history of anesthetic complications  Airway Mallampati: II  TM Distance: >3 FB Neck ROM: Full    Dental  (+) Dental Advisory Given, Teeth Intact   Pulmonary former smoker,    breath sounds clear to auscultation       Cardiovascular negative cardio ROS   Rhythm:Regular Rate:Normal     Neuro/Psych negative neurological ROS  negative psych ROS   GI/Hepatic negative GI ROS, Neg liver ROS,   Endo/Other  negative endocrine ROS  Renal/GU negative Renal ROS     Musculoskeletal negative musculoskeletal ROS (+)   Abdominal   Peds  Hematology negative hematology ROS (+)   Anesthesia Other Findings   Reproductive/Obstetrics  AUB                             Anesthesia Physical Anesthesia Plan  ASA: I  Anesthesia Plan: General   Post-op Pain Management:    Induction: Intravenous  PONV Risk Score and Plan: 3 and Treatment may vary due to age or medical condition, Ondansetron, Midazolam and Dexamethasone  Airway Management Planned: LMA  Additional Equipment: None  Intra-op Plan:   Post-operative Plan: Extubation in OR  Informed Consent: I have reviewed the patients History and Physical, chart, labs and discussed the procedure including the risks, benefits and alternatives for the proposed anesthesia with the patient or authorized representative who has indicated his/her understanding and acceptance.   Dental advisory given  Plan Discussed with: CRNA and Anesthesiologist  Anesthesia Plan Comments:        Anesthesia Quick Evaluation

## 2018-11-02 NOTE — Anesthesia Procedure Notes (Signed)
Procedure Name: LMA Insertion Date/Time: 11/02/2018 12:14 PM Performed by: Burna Cashonrad, Kaimen Peine C, CRNA Pre-anesthesia Checklist: Patient identified, Emergency Drugs available, Suction available and Patient being monitored Patient Re-evaluated:Patient Re-evaluated prior to induction Oxygen Delivery Method: Circle system utilized Preoxygenation: Pre-oxygenation with 100% oxygen Induction Type: IV induction Ventilation: Mask ventilation without difficulty LMA: LMA inserted LMA Size: 4.0 Number of attempts: 1 Airway Equipment and Method: Bite block Placement Confirmation: positive ETCO2 Tube secured with: Tape Dental Injury: Teeth and Oropharynx as per pre-operative assessment

## 2018-11-02 NOTE — Interval H&P Note (Signed)
History and Physical Interval Note:  11/02/2018 11:55 AM  Victoria Huerta  has presented today for surgery, with the diagnosis of N93.9 abnormal uterine bleeding N84.0 endometrial polyp  The various methods of treatment have been discussed with the patient and family. After consideration of risks, benefits and other options for treatment, the patient has consented to  Procedure(s): DILATATION & CURETTAGE/HYSTEROSCOPY WITH MYOSURE (N/A) as a surgical intervention .  The patient's history has been reviewed, patient examined, no change in status, stable for surgery.  I have reviewed the patient's chart and labs.  Questions were answered to the patient's satisfaction.     Sharon SellerJennifer M Zubin Pontillo

## 2018-11-02 NOTE — Op Note (Signed)
Operative Report  PreOp: Abnormal uterine bleeding, Uterine polyp PostOp: same Procedure:  Hysteroscopy, Dilation and Curettage, Myosure polypectomy Surgeon: Dr. Myna HidalgoJennifer Daphnie Huerta Anesthesia: General, cervical block Complications:noen EBL: 5cc UOP: 30cc IVF:800cc Discrepancy: 110cc  Findings: 8cm anteverted uterus with proliferative endometrium, 1cm posterior polyp was noted.  Bilateral ostia visualized Specimens: 1) endometrial curettings with uterine polyp   Procedure: The patient was taken to the operating room where she underwent general anesthesia without difficulty. The patient was placed in a low lithotomy position using Allen stirrups. The patient was examined with the findings as noted above.  She was then prepped and draped in the normal sterile fashion. The bladder was drained using a red rubber urethral catheter. A sterile speculum was inserted into the vagina. 20cc of Lidocaine with epinephrine was used for a cervical block.  A single tooth tenaculum was placed on the anterior lip of the cervix. The uterus was then sounded to 8cm. The endocervical canal was then serially dilated using Hank dilators.  The diagnostic hysteroscope was then inserted without difficulty and noted to have the findings as listed above. Visualization was achieved using NS as a distending medium. The myosure was inserted and resection was completed.  The hysteroscope was removed and sharp curettage was performed. The tissue was sent to pathology.   The hysteroscope was re-inserted- no uterine perforations were noted.  All instrument were then removed. Hemostasis was observed at the cervical site.  The patient was repositioned to the supine position. The patient tolerated the procedure without any complications and taken to recovery in stable condition.   Myna HidalgoJennifer Maddax Palinkas, DO [phone] (pager) [phone] (office)

## 2018-11-02 NOTE — Discharge Instructions (Addendum)
HOME INSTRUCTIONS  Please note any unusual or excessive bleeding, pain, swelling. Mild dizziness or drowsiness are normal for about 24 hours after surgery.   Shower when comfortable  Restrictions: No driving for 24 hours or while taking pain medications.  Activity:  No heavy lifting (> 10 lbs), nothing in vagina (no tampons, douching, or intercourse) x 1-2 weeks; no tub baths for 1-2 weeks Vaginal spotting is expected but if your bleeding is heavy, period like,  please call the office   Diet:  You may return to your regular diet.  Do not eat large meals.  Eat small frequent meals throughout the day.  Continue to drink a good amount of water at least 6-8 glasses of water per day, hydration is very important for the healing process.  Pain Management: Take Motrin and/or Tyllenol as prescribed/needed for pain.  Always take prescription pain medication with food, it may cause constipation, increase fluids and fiber and you may want to take an over-the-counter stool softener like Colace as needed up to 2x a day.    Alcohol -- Avoid for 24 hours and while taking pain medications.  Nausea: Take sips of ginger ale or soda  Fever -- Call physician if temperature over 101 degrees  Follow up:  If you do not already have a follow up appointment scheduled, please call the office at [phone].  If you experience fever (a temperature greater than 100.4), pain unrelieved by pain medication, shortness of breath, swelling of a single leg, or any other symptoms which are concerning to you please the office immediately.       Post Anesthesia Home Care Instructions  Activity: Get plenty of rest for the remainder of the day. A responsible individual must stay with you for 24 hours following the procedure.  For the next 24 hours, DO NOT: -Drive a car -Advertising copywriterperate machinery -Drink alcoholic beverages -Take any medication unless instructed by your physician -Make any legal decisions or sign important  papers.  Meals: Start with liquid foods such as gelatin or soup. Progress to regular foods as tolerated. Avoid greasy, spicy, heavy foods. If nausea and/or vomiting occur, drink only clear liquids until the nausea and/or vomiting subsides. Call your physician if vomiting continues.  Special Instructions/Symptoms: Your throat may feel dry or sore from the anesthesia or the breathing tube placed in your throat during surgery. If this causes discomfort, gargle with warm salt water. The discomfort should disappear within 24 hours.  If you had a scopolamine patch placed behind your ear for the management of post- operative nausea and/or vomiting:  1. The medication in the patch is effective for 72 hours, after which it should be removed.  Wrap patch in a tissue and discard in the trash. Wash hands thoroughly with soap and water. 2. You may remove the patch earlier than 72 hours if you experience unpleasant side effects which may include dry mouth, dizziness or visual disturbances. 3. Avoid touching the patch. Wash your hands with soap and water after contact with the patch.

## 2018-11-02 NOTE — Transfer of Care (Signed)
Immediate Anesthesia Transfer of Care Note  Patient: Victoria Huerta  Procedure(s) Performed: DILATATION & CURETTAGE/HYSTEROSCOPY WITH MYOSURE (N/A Uterus)  Patient Location: PACU  Anesthesia Type:General  Level of Consciousness: sedated  Airway & Oxygen Therapy: Patient Spontanous Breathing and Patient connected to face mask oxygen  Post-op Assessment: Report given to RN and Post -op Vital signs reviewed and stable  Post vital signs: Reviewed and stable  Last Vitals:  Vitals Value Taken Time  BP 100/54 11/02/2018 12:55 PM  Temp    Pulse 75 11/02/2018 12:57 PM  Resp 16 11/02/2018 12:57 PM  SpO2 100 % 11/02/2018 12:57 PM  Vitals shown include unvalidated device data.  Last Pain:  Vitals:   11/02/18 1146  TempSrc: Oral  PainSc: 0-No pain         Complications: No apparent anesthesia complications

## 2018-11-05 ENCOUNTER — Encounter (HOSPITAL_BASED_OUTPATIENT_CLINIC_OR_DEPARTMENT_OTHER): Payer: Self-pay | Admitting: Obstetrics & Gynecology

## 2019-01-04 ENCOUNTER — Other Ambulatory Visit: Payer: Self-pay | Admitting: Internal Medicine

## 2019-01-04 DIAGNOSIS — Z1231 Encounter for screening mammogram for malignant neoplasm of breast: Secondary | ICD-10-CM

## 2019-01-28 ENCOUNTER — Ambulatory Visit
Admission: RE | Admit: 2019-01-28 | Discharge: 2019-01-28 | Disposition: A | Discharge status: Home / Self Care | Payer: BLUE CROSS/BLUE SHIELD | Source: Ambulatory Visit | Attending: Internal Medicine | Admitting: Internal Medicine

## 2019-01-28 DIAGNOSIS — Z1231 Encounter for screening mammogram for malignant neoplasm of breast: Secondary | ICD-10-CM

## 2019-04-23 DIAGNOSIS — D1801 Hemangioma of skin and subcutaneous tissue: Secondary | ICD-10-CM | POA: Diagnosis not present

## 2019-04-23 DIAGNOSIS — D225 Melanocytic nevi of trunk: Secondary | ICD-10-CM | POA: Diagnosis not present

## 2019-04-23 DIAGNOSIS — L821 Other seborrheic keratosis: Secondary | ICD-10-CM | POA: Diagnosis not present

## 2019-04-23 DIAGNOSIS — L814 Other melanin hyperpigmentation: Secondary | ICD-10-CM | POA: Diagnosis not present

## 2019-08-19 DIAGNOSIS — L708 Other acne: Secondary | ICD-10-CM | POA: Diagnosis not present

## 2019-09-25 DIAGNOSIS — E785 Hyperlipidemia, unspecified: Secondary | ICD-10-CM | POA: Diagnosis not present

## 2019-09-25 DIAGNOSIS — Z8249 Family history of ischemic heart disease and other diseases of the circulatory system: Secondary | ICD-10-CM | POA: Diagnosis not present

## 2019-09-25 DIAGNOSIS — J302 Other seasonal allergic rhinitis: Secondary | ICD-10-CM | POA: Diagnosis not present

## 2019-09-25 DIAGNOSIS — Z9189 Other specified personal risk factors, not elsewhere classified: Secondary | ICD-10-CM | POA: Diagnosis not present

## 2019-09-25 DIAGNOSIS — Z23 Encounter for immunization: Secondary | ICD-10-CM | POA: Diagnosis not present

## 2019-09-27 DIAGNOSIS — Z01419 Encounter for gynecological examination (general) (routine) without abnormal findings: Secondary | ICD-10-CM | POA: Diagnosis not present

## 2019-11-11 DIAGNOSIS — Z0000 Encounter for general adult medical examination without abnormal findings: Secondary | ICD-10-CM | POA: Diagnosis not present

## 2019-11-11 DIAGNOSIS — E785 Hyperlipidemia, unspecified: Secondary | ICD-10-CM | POA: Diagnosis not present

## 2020-01-27 DIAGNOSIS — J302 Other seasonal allergic rhinitis: Secondary | ICD-10-CM | POA: Diagnosis not present

## 2020-01-27 DIAGNOSIS — J0100 Acute maxillary sinusitis, unspecified: Secondary | ICD-10-CM | POA: Diagnosis not present

## 2020-03-06 ENCOUNTER — Other Ambulatory Visit: Payer: Self-pay | Admitting: Obstetrics & Gynecology

## 2020-03-06 DIAGNOSIS — Z1231 Encounter for screening mammogram for malignant neoplasm of breast: Secondary | ICD-10-CM

## 2020-04-23 ENCOUNTER — Ambulatory Visit
Admission: RE | Admit: 2020-04-23 | Discharge: 2020-04-23 | Disposition: A | Discharge status: Home / Self Care | Payer: BLUE CROSS/BLUE SHIELD | Source: Ambulatory Visit | Attending: Obstetrics & Gynecology | Admitting: Obstetrics & Gynecology

## 2020-04-23 ENCOUNTER — Other Ambulatory Visit: Payer: Self-pay

## 2020-04-23 DIAGNOSIS — Z1231 Encounter for screening mammogram for malignant neoplasm of breast: Secondary | ICD-10-CM

## 2020-07-09 DIAGNOSIS — Z01818 Encounter for other preprocedural examination: Secondary | ICD-10-CM | POA: Diagnosis not present

## 2020-08-21 DIAGNOSIS — Z1159 Encounter for screening for other viral diseases: Secondary | ICD-10-CM | POA: Diagnosis not present

## 2020-08-26 DIAGNOSIS — Z1211 Encounter for screening for malignant neoplasm of colon: Secondary | ICD-10-CM | POA: Diagnosis not present

## 2020-08-26 DIAGNOSIS — K5730 Diverticulosis of large intestine without perforation or abscess without bleeding: Secondary | ICD-10-CM | POA: Diagnosis not present

## 2020-08-26 DIAGNOSIS — K635 Polyp of colon: Secondary | ICD-10-CM | POA: Diagnosis not present

## 2020-08-26 DIAGNOSIS — D122 Benign neoplasm of ascending colon: Secondary | ICD-10-CM | POA: Diagnosis not present

## 2020-10-16 DIAGNOSIS — Z20822 Contact with and (suspected) exposure to covid-19: Secondary | ICD-10-CM | POA: Diagnosis not present

## 2020-11-09 DIAGNOSIS — Z01419 Encounter for gynecological examination (general) (routine) without abnormal findings: Secondary | ICD-10-CM | POA: Diagnosis not present

## 2020-12-07 DIAGNOSIS — E559 Vitamin D deficiency, unspecified: Secondary | ICD-10-CM | POA: Diagnosis not present

## 2020-12-07 DIAGNOSIS — Z0000 Encounter for general adult medical examination without abnormal findings: Secondary | ICD-10-CM | POA: Diagnosis not present

## 2020-12-07 DIAGNOSIS — Z13220 Encounter for screening for lipoid disorders: Secondary | ICD-10-CM | POA: Diagnosis not present

## 2021-04-09 DIAGNOSIS — M654 Radial styloid tenosynovitis [de Quervain]: Secondary | ICD-10-CM | POA: Diagnosis not present

## 2021-04-16 DIAGNOSIS — M25532 Pain in left wrist: Secondary | ICD-10-CM | POA: Diagnosis not present

## 2021-04-16 DIAGNOSIS — M25632 Stiffness of left wrist, not elsewhere classified: Secondary | ICD-10-CM | POA: Diagnosis not present

## 2021-04-19 DIAGNOSIS — M25632 Stiffness of left wrist, not elsewhere classified: Secondary | ICD-10-CM | POA: Diagnosis not present

## 2021-04-19 DIAGNOSIS — M25532 Pain in left wrist: Secondary | ICD-10-CM | POA: Diagnosis not present

## 2021-04-21 DIAGNOSIS — M25632 Stiffness of left wrist, not elsewhere classified: Secondary | ICD-10-CM | POA: Diagnosis not present

## 2021-04-21 DIAGNOSIS — M25532 Pain in left wrist: Secondary | ICD-10-CM | POA: Diagnosis not present

## 2021-05-03 DIAGNOSIS — M25632 Stiffness of left wrist, not elsewhere classified: Secondary | ICD-10-CM | POA: Diagnosis not present

## 2021-05-03 DIAGNOSIS — M25532 Pain in left wrist: Secondary | ICD-10-CM | POA: Diagnosis not present

## 2021-05-12 DIAGNOSIS — M25532 Pain in left wrist: Secondary | ICD-10-CM | POA: Diagnosis not present

## 2021-05-12 DIAGNOSIS — M25632 Stiffness of left wrist, not elsewhere classified: Secondary | ICD-10-CM | POA: Diagnosis not present

## 2021-06-07 DIAGNOSIS — M659 Synovitis and tenosynovitis, unspecified: Secondary | ICD-10-CM | POA: Diagnosis not present

## 2021-06-07 DIAGNOSIS — M19032 Primary osteoarthritis, left wrist: Secondary | ICD-10-CM | POA: Diagnosis not present

## 2021-06-07 DIAGNOSIS — S638X2S Sprain of other part of left wrist and hand, sequela: Secondary | ICD-10-CM | POA: Diagnosis not present

## 2021-06-09 DIAGNOSIS — M25632 Stiffness of left wrist, not elsewhere classified: Secondary | ICD-10-CM | POA: Diagnosis not present

## 2021-06-09 DIAGNOSIS — M25532 Pain in left wrist: Secondary | ICD-10-CM | POA: Diagnosis not present

## 2021-06-11 DIAGNOSIS — M25632 Stiffness of left wrist, not elsewhere classified: Secondary | ICD-10-CM | POA: Diagnosis not present

## 2021-06-11 DIAGNOSIS — M25532 Pain in left wrist: Secondary | ICD-10-CM | POA: Diagnosis not present

## 2021-06-14 DIAGNOSIS — S638X2S Sprain of other part of left wrist and hand, sequela: Secondary | ICD-10-CM | POA: Diagnosis not present

## 2021-06-14 DIAGNOSIS — M19032 Primary osteoarthritis, left wrist: Secondary | ICD-10-CM | POA: Diagnosis not present

## 2021-06-14 DIAGNOSIS — M659 Synovitis and tenosynovitis, unspecified: Secondary | ICD-10-CM | POA: Diagnosis not present

## 2021-06-16 DIAGNOSIS — M19032 Primary osteoarthritis, left wrist: Secondary | ICD-10-CM | POA: Diagnosis not present

## 2021-06-16 DIAGNOSIS — M659 Synovitis and tenosynovitis, unspecified: Secondary | ICD-10-CM | POA: Diagnosis not present

## 2021-06-16 DIAGNOSIS — S638X2S Sprain of other part of left wrist and hand, sequela: Secondary | ICD-10-CM | POA: Diagnosis not present

## 2021-06-16 DIAGNOSIS — M25532 Pain in left wrist: Secondary | ICD-10-CM | POA: Diagnosis not present

## 2021-06-16 DIAGNOSIS — M25632 Stiffness of left wrist, not elsewhere classified: Secondary | ICD-10-CM | POA: Diagnosis not present

## 2021-07-01 DIAGNOSIS — M19032 Primary osteoarthritis, left wrist: Secondary | ICD-10-CM | POA: Diagnosis not present

## 2021-07-01 DIAGNOSIS — S638X2S Sprain of other part of left wrist and hand, sequela: Secondary | ICD-10-CM | POA: Diagnosis not present

## 2021-07-01 DIAGNOSIS — M659 Synovitis and tenosynovitis, unspecified: Secondary | ICD-10-CM | POA: Diagnosis not present

## 2021-07-06 DIAGNOSIS — M9907 Segmental and somatic dysfunction of upper extremity: Secondary | ICD-10-CM | POA: Diagnosis not present

## 2021-07-06 DIAGNOSIS — M654 Radial styloid tenosynovitis [de Quervain]: Secondary | ICD-10-CM | POA: Diagnosis not present

## 2021-07-08 DIAGNOSIS — M9907 Segmental and somatic dysfunction of upper extremity: Secondary | ICD-10-CM | POA: Diagnosis not present

## 2021-07-08 DIAGNOSIS — M654 Radial styloid tenosynovitis [de Quervain]: Secondary | ICD-10-CM | POA: Diagnosis not present

## 2021-07-13 DIAGNOSIS — M9907 Segmental and somatic dysfunction of upper extremity: Secondary | ICD-10-CM | POA: Diagnosis not present

## 2021-07-13 DIAGNOSIS — M654 Radial styloid tenosynovitis [de Quervain]: Secondary | ICD-10-CM | POA: Diagnosis not present

## 2021-07-16 DIAGNOSIS — M654 Radial styloid tenosynovitis [de Quervain]: Secondary | ICD-10-CM | POA: Diagnosis not present

## 2021-07-16 DIAGNOSIS — M9907 Segmental and somatic dysfunction of upper extremity: Secondary | ICD-10-CM | POA: Diagnosis not present

## 2021-07-19 DIAGNOSIS — M9907 Segmental and somatic dysfunction of upper extremity: Secondary | ICD-10-CM | POA: Diagnosis not present

## 2021-07-19 DIAGNOSIS — M654 Radial styloid tenosynovitis [de Quervain]: Secondary | ICD-10-CM | POA: Diagnosis not present

## 2021-07-19 DIAGNOSIS — M19032 Primary osteoarthritis, left wrist: Secondary | ICD-10-CM | POA: Diagnosis not present

## 2021-07-22 DIAGNOSIS — M9907 Segmental and somatic dysfunction of upper extremity: Secondary | ICD-10-CM | POA: Diagnosis not present

## 2021-07-22 DIAGNOSIS — M19032 Primary osteoarthritis, left wrist: Secondary | ICD-10-CM | POA: Diagnosis not present

## 2021-07-22 DIAGNOSIS — M654 Radial styloid tenosynovitis [de Quervain]: Secondary | ICD-10-CM | POA: Diagnosis not present

## 2021-07-26 DIAGNOSIS — M19032 Primary osteoarthritis, left wrist: Secondary | ICD-10-CM | POA: Diagnosis not present

## 2021-07-26 DIAGNOSIS — M654 Radial styloid tenosynovitis [de Quervain]: Secondary | ICD-10-CM | POA: Diagnosis not present

## 2021-07-26 DIAGNOSIS — M9907 Segmental and somatic dysfunction of upper extremity: Secondary | ICD-10-CM | POA: Diagnosis not present

## 2021-07-27 DIAGNOSIS — M9904 Segmental and somatic dysfunction of sacral region: Secondary | ICD-10-CM | POA: Diagnosis not present

## 2021-07-27 DIAGNOSIS — S336XXA Sprain of sacroiliac joint, initial encounter: Secondary | ICD-10-CM | POA: Diagnosis not present

## 2021-07-27 DIAGNOSIS — M7602 Gluteal tendinitis, left hip: Secondary | ICD-10-CM | POA: Diagnosis not present

## 2021-08-03 DIAGNOSIS — M9904 Segmental and somatic dysfunction of sacral region: Secondary | ICD-10-CM | POA: Diagnosis not present

## 2021-08-03 DIAGNOSIS — M7602 Gluteal tendinitis, left hip: Secondary | ICD-10-CM | POA: Diagnosis not present

## 2021-08-03 DIAGNOSIS — S336XXA Sprain of sacroiliac joint, initial encounter: Secondary | ICD-10-CM | POA: Diagnosis not present

## 2021-08-09 DIAGNOSIS — S336XXA Sprain of sacroiliac joint, initial encounter: Secondary | ICD-10-CM | POA: Diagnosis not present

## 2021-08-09 DIAGNOSIS — M7602 Gluteal tendinitis, left hip: Secondary | ICD-10-CM | POA: Diagnosis not present

## 2021-08-09 DIAGNOSIS — M9904 Segmental and somatic dysfunction of sacral region: Secondary | ICD-10-CM | POA: Diagnosis not present

## 2021-08-13 DIAGNOSIS — M7602 Gluteal tendinitis, left hip: Secondary | ICD-10-CM | POA: Diagnosis not present

## 2021-08-13 DIAGNOSIS — M9904 Segmental and somatic dysfunction of sacral region: Secondary | ICD-10-CM | POA: Diagnosis not present

## 2021-08-13 DIAGNOSIS — S336XXA Sprain of sacroiliac joint, initial encounter: Secondary | ICD-10-CM | POA: Diagnosis not present

## 2021-08-16 DIAGNOSIS — M9904 Segmental and somatic dysfunction of sacral region: Secondary | ICD-10-CM | POA: Diagnosis not present

## 2021-08-16 DIAGNOSIS — M7602 Gluteal tendinitis, left hip: Secondary | ICD-10-CM | POA: Diagnosis not present

## 2021-08-16 DIAGNOSIS — S336XXA Sprain of sacroiliac joint, initial encounter: Secondary | ICD-10-CM | POA: Diagnosis not present

## 2021-08-19 DIAGNOSIS — M19032 Primary osteoarthritis, left wrist: Secondary | ICD-10-CM | POA: Diagnosis not present

## 2021-08-19 DIAGNOSIS — S638X2S Sprain of other part of left wrist and hand, sequela: Secondary | ICD-10-CM | POA: Diagnosis not present

## 2021-08-23 DIAGNOSIS — M7602 Gluteal tendinitis, left hip: Secondary | ICD-10-CM | POA: Diagnosis not present

## 2021-08-23 DIAGNOSIS — S336XXA Sprain of sacroiliac joint, initial encounter: Secondary | ICD-10-CM | POA: Diagnosis not present

## 2021-08-23 DIAGNOSIS — M9904 Segmental and somatic dysfunction of sacral region: Secondary | ICD-10-CM | POA: Diagnosis not present

## 2021-08-30 DIAGNOSIS — S336XXA Sprain of sacroiliac joint, initial encounter: Secondary | ICD-10-CM | POA: Diagnosis not present

## 2021-08-30 DIAGNOSIS — M7602 Gluteal tendinitis, left hip: Secondary | ICD-10-CM | POA: Diagnosis not present

## 2021-08-30 DIAGNOSIS — M9904 Segmental and somatic dysfunction of sacral region: Secondary | ICD-10-CM | POA: Diagnosis not present

## 2021-09-06 DIAGNOSIS — M9907 Segmental and somatic dysfunction of upper extremity: Secondary | ICD-10-CM | POA: Diagnosis not present

## 2021-09-06 DIAGNOSIS — M654 Radial styloid tenosynovitis [de Quervain]: Secondary | ICD-10-CM | POA: Diagnosis not present

## 2021-09-09 DIAGNOSIS — M9907 Segmental and somatic dysfunction of upper extremity: Secondary | ICD-10-CM | POA: Diagnosis not present

## 2021-09-09 DIAGNOSIS — M654 Radial styloid tenosynovitis [de Quervain]: Secondary | ICD-10-CM | POA: Diagnosis not present

## 2021-09-13 DIAGNOSIS — S336XXA Sprain of sacroiliac joint, initial encounter: Secondary | ICD-10-CM | POA: Diagnosis not present

## 2021-09-13 DIAGNOSIS — M7602 Gluteal tendinitis, left hip: Secondary | ICD-10-CM | POA: Diagnosis not present

## 2021-09-13 DIAGNOSIS — M9904 Segmental and somatic dysfunction of sacral region: Secondary | ICD-10-CM | POA: Diagnosis not present

## 2021-09-16 DIAGNOSIS — S336XXA Sprain of sacroiliac joint, initial encounter: Secondary | ICD-10-CM | POA: Diagnosis not present

## 2021-09-16 DIAGNOSIS — M7602 Gluteal tendinitis, left hip: Secondary | ICD-10-CM | POA: Diagnosis not present

## 2021-09-16 DIAGNOSIS — M9904 Segmental and somatic dysfunction of sacral region: Secondary | ICD-10-CM | POA: Diagnosis not present

## 2021-09-20 DIAGNOSIS — M7602 Gluteal tendinitis, left hip: Secondary | ICD-10-CM | POA: Diagnosis not present

## 2021-09-20 DIAGNOSIS — M9904 Segmental and somatic dysfunction of sacral region: Secondary | ICD-10-CM | POA: Diagnosis not present

## 2021-09-20 DIAGNOSIS — S336XXA Sprain of sacroiliac joint, initial encounter: Secondary | ICD-10-CM | POA: Diagnosis not present

## 2021-09-23 DIAGNOSIS — M7602 Gluteal tendinitis, left hip: Secondary | ICD-10-CM | POA: Diagnosis not present

## 2021-09-23 DIAGNOSIS — S336XXA Sprain of sacroiliac joint, initial encounter: Secondary | ICD-10-CM | POA: Diagnosis not present

## 2021-09-23 DIAGNOSIS — M9904 Segmental and somatic dysfunction of sacral region: Secondary | ICD-10-CM | POA: Diagnosis not present

## 2021-09-30 DIAGNOSIS — S336XXA Sprain of sacroiliac joint, initial encounter: Secondary | ICD-10-CM | POA: Diagnosis not present

## 2021-09-30 DIAGNOSIS — M9904 Segmental and somatic dysfunction of sacral region: Secondary | ICD-10-CM | POA: Diagnosis not present

## 2021-09-30 DIAGNOSIS — M7602 Gluteal tendinitis, left hip: Secondary | ICD-10-CM | POA: Diagnosis not present

## 2021-10-05 DIAGNOSIS — N6341 Unspecified lump in right breast, subareolar: Secondary | ICD-10-CM | POA: Diagnosis not present

## 2021-10-06 DIAGNOSIS — N6001 Solitary cyst of right breast: Secondary | ICD-10-CM | POA: Diagnosis not present

## 2021-10-06 DIAGNOSIS — R922 Inconclusive mammogram: Secondary | ICD-10-CM | POA: Diagnosis not present

## 2021-10-06 DIAGNOSIS — N6341 Unspecified lump in right breast, subareolar: Secondary | ICD-10-CM | POA: Diagnosis not present

## 2021-10-14 DIAGNOSIS — S336XXA Sprain of sacroiliac joint, initial encounter: Secondary | ICD-10-CM | POA: Diagnosis not present

## 2021-10-14 DIAGNOSIS — M9904 Segmental and somatic dysfunction of sacral region: Secondary | ICD-10-CM | POA: Diagnosis not present

## 2021-10-14 DIAGNOSIS — M7602 Gluteal tendinitis, left hip: Secondary | ICD-10-CM | POA: Diagnosis not present

## 2021-10-28 DIAGNOSIS — M9904 Segmental and somatic dysfunction of sacral region: Secondary | ICD-10-CM | POA: Diagnosis not present

## 2021-10-28 DIAGNOSIS — S336XXA Sprain of sacroiliac joint, initial encounter: Secondary | ICD-10-CM | POA: Diagnosis not present

## 2021-10-28 DIAGNOSIS — M7602 Gluteal tendinitis, left hip: Secondary | ICD-10-CM | POA: Diagnosis not present

## 2021-11-04 DIAGNOSIS — S336XXA Sprain of sacroiliac joint, initial encounter: Secondary | ICD-10-CM | POA: Diagnosis not present

## 2021-11-04 DIAGNOSIS — M7602 Gluteal tendinitis, left hip: Secondary | ICD-10-CM | POA: Diagnosis not present

## 2021-11-04 DIAGNOSIS — M9904 Segmental and somatic dysfunction of sacral region: Secondary | ICD-10-CM | POA: Diagnosis not present

## 2021-11-11 DIAGNOSIS — M9904 Segmental and somatic dysfunction of sacral region: Secondary | ICD-10-CM | POA: Diagnosis not present

## 2021-11-11 DIAGNOSIS — S336XXA Sprain of sacroiliac joint, initial encounter: Secondary | ICD-10-CM | POA: Diagnosis not present

## 2021-11-11 DIAGNOSIS — M7602 Gluteal tendinitis, left hip: Secondary | ICD-10-CM | POA: Diagnosis not present

## 2021-11-15 DIAGNOSIS — M9904 Segmental and somatic dysfunction of sacral region: Secondary | ICD-10-CM | POA: Diagnosis not present

## 2021-11-15 DIAGNOSIS — S336XXA Sprain of sacroiliac joint, initial encounter: Secondary | ICD-10-CM | POA: Diagnosis not present

## 2021-11-15 DIAGNOSIS — M7602 Gluteal tendinitis, left hip: Secondary | ICD-10-CM | POA: Diagnosis not present

## 2021-11-30 DIAGNOSIS — S336XXA Sprain of sacroiliac joint, initial encounter: Secondary | ICD-10-CM | POA: Diagnosis not present

## 2021-11-30 DIAGNOSIS — M9904 Segmental and somatic dysfunction of sacral region: Secondary | ICD-10-CM | POA: Diagnosis not present

## 2021-11-30 DIAGNOSIS — M7602 Gluteal tendinitis, left hip: Secondary | ICD-10-CM | POA: Diagnosis not present

## 2021-12-07 DIAGNOSIS — Z13220 Encounter for screening for lipoid disorders: Secondary | ICD-10-CM | POA: Diagnosis not present

## 2021-12-07 DIAGNOSIS — Z0000 Encounter for general adult medical examination without abnormal findings: Secondary | ICD-10-CM | POA: Diagnosis not present

## 2021-12-07 DIAGNOSIS — E559 Vitamin D deficiency, unspecified: Secondary | ICD-10-CM | POA: Diagnosis not present

## 2021-12-13 DIAGNOSIS — M7602 Gluteal tendinitis, left hip: Secondary | ICD-10-CM | POA: Diagnosis not present

## 2021-12-13 DIAGNOSIS — M9904 Segmental and somatic dysfunction of sacral region: Secondary | ICD-10-CM | POA: Diagnosis not present

## 2021-12-20 DIAGNOSIS — M9904 Segmental and somatic dysfunction of sacral region: Secondary | ICD-10-CM | POA: Diagnosis not present

## 2021-12-20 DIAGNOSIS — M7602 Gluteal tendinitis, left hip: Secondary | ICD-10-CM | POA: Diagnosis not present

## 2021-12-27 DIAGNOSIS — M9904 Segmental and somatic dysfunction of sacral region: Secondary | ICD-10-CM | POA: Diagnosis not present

## 2021-12-27 DIAGNOSIS — M7602 Gluteal tendinitis, left hip: Secondary | ICD-10-CM | POA: Diagnosis not present

## 2022-01-03 DIAGNOSIS — M9904 Segmental and somatic dysfunction of sacral region: Secondary | ICD-10-CM | POA: Diagnosis not present

## 2022-01-03 DIAGNOSIS — M7602 Gluteal tendinitis, left hip: Secondary | ICD-10-CM | POA: Diagnosis not present

## 2022-01-10 DIAGNOSIS — M9904 Segmental and somatic dysfunction of sacral region: Secondary | ICD-10-CM | POA: Diagnosis not present

## 2022-01-10 DIAGNOSIS — M7602 Gluteal tendinitis, left hip: Secondary | ICD-10-CM | POA: Diagnosis not present

## 2022-01-17 DIAGNOSIS — M9904 Segmental and somatic dysfunction of sacral region: Secondary | ICD-10-CM | POA: Diagnosis not present

## 2022-01-17 DIAGNOSIS — M7602 Gluteal tendinitis, left hip: Secondary | ICD-10-CM | POA: Diagnosis not present

## 2022-01-24 DIAGNOSIS — M7602 Gluteal tendinitis, left hip: Secondary | ICD-10-CM | POA: Diagnosis not present

## 2022-01-24 DIAGNOSIS — M9904 Segmental and somatic dysfunction of sacral region: Secondary | ICD-10-CM | POA: Diagnosis not present

## 2022-01-31 DIAGNOSIS — M9904 Segmental and somatic dysfunction of sacral region: Secondary | ICD-10-CM | POA: Diagnosis not present

## 2022-01-31 DIAGNOSIS — M7602 Gluteal tendinitis, left hip: Secondary | ICD-10-CM | POA: Diagnosis not present

## 2022-02-07 DIAGNOSIS — M9904 Segmental and somatic dysfunction of sacral region: Secondary | ICD-10-CM | POA: Diagnosis not present

## 2022-02-07 DIAGNOSIS — M7602 Gluteal tendinitis, left hip: Secondary | ICD-10-CM | POA: Diagnosis not present

## 2022-02-14 DIAGNOSIS — M9904 Segmental and somatic dysfunction of sacral region: Secondary | ICD-10-CM | POA: Diagnosis not present

## 2022-02-14 DIAGNOSIS — M7602 Gluteal tendinitis, left hip: Secondary | ICD-10-CM | POA: Diagnosis not present

## 2022-02-21 DIAGNOSIS — M9904 Segmental and somatic dysfunction of sacral region: Secondary | ICD-10-CM | POA: Diagnosis not present

## 2022-02-21 DIAGNOSIS — M7602 Gluteal tendinitis, left hip: Secondary | ICD-10-CM | POA: Diagnosis not present

## 2022-04-05 IMAGING — MG DIGITAL SCREENING BILAT W/ TOMO W/ CAD
8 series · 9 of 24 positions shown · non-contrast
Comparison: Previous exam(s).

CLINICAL DATA: Screening.

EXAM:
DIGITAL SCREENING BILATERAL MAMMOGRAM WITH TOMO AND CAD

[L CC synth-2D]
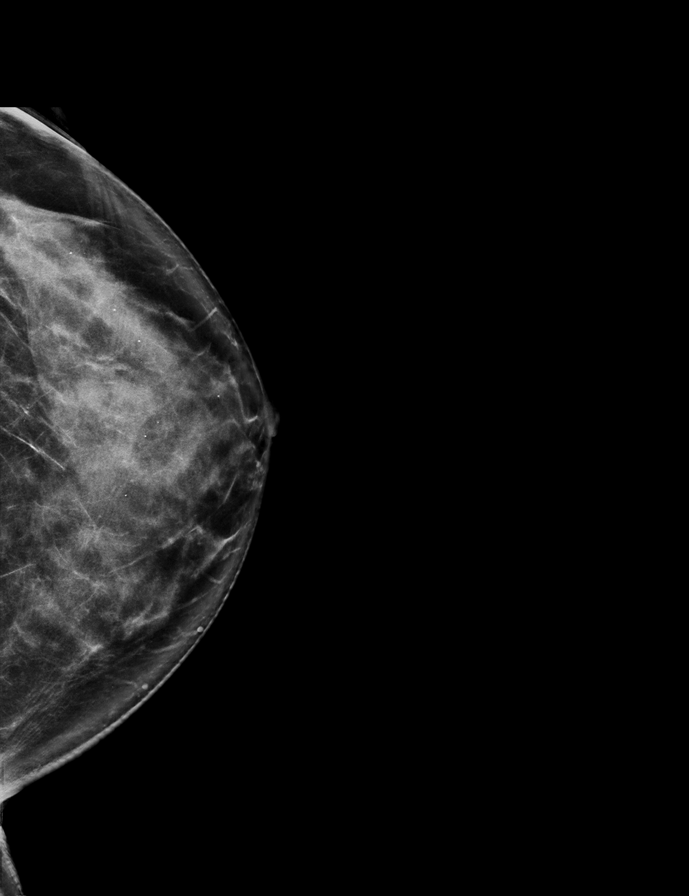

[R CC synth-2D]
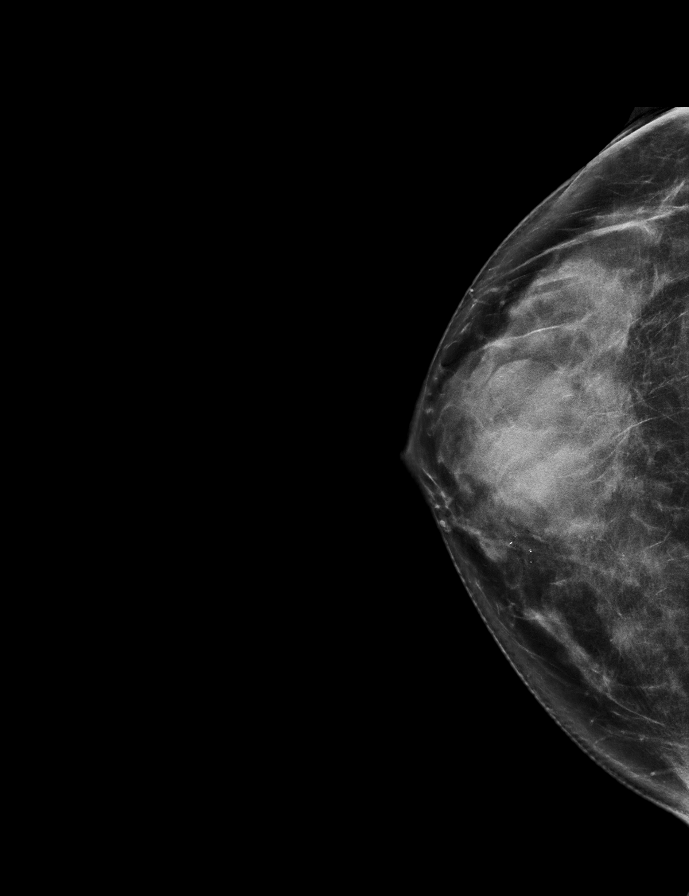

[L MLO synth-2D]
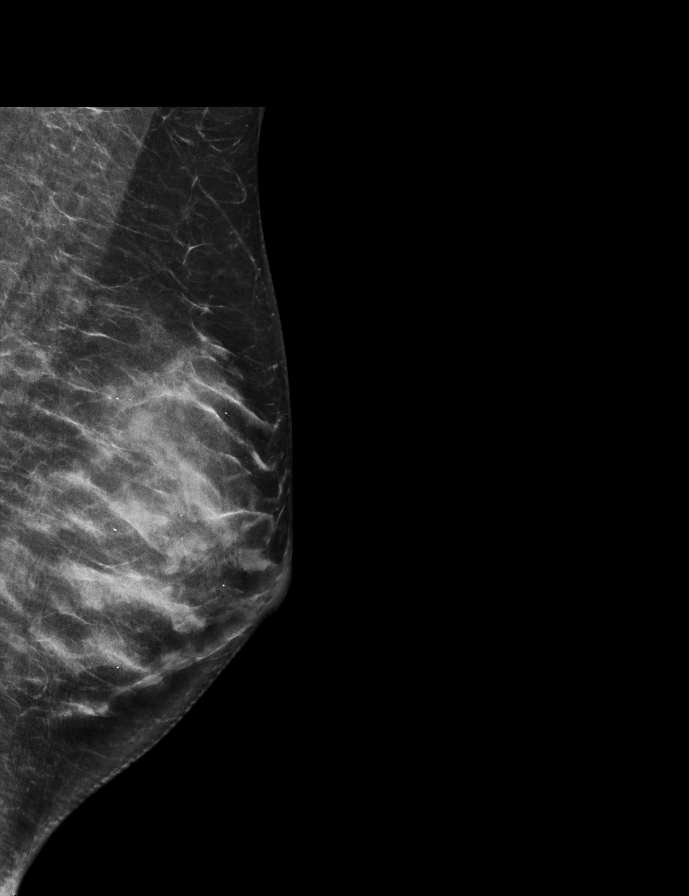

[R MLO synth-2D]
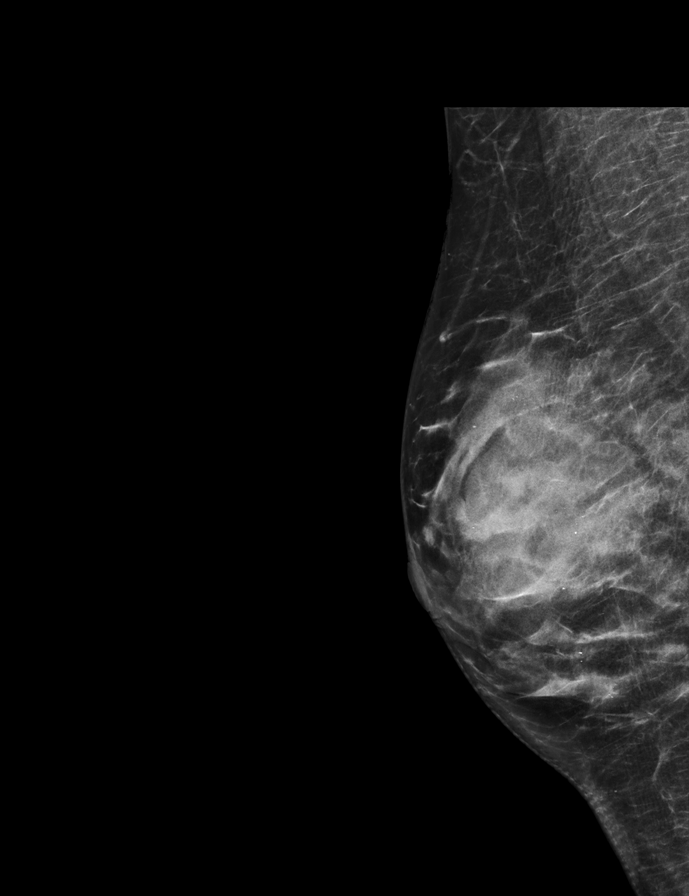

[L MLO tomo · 2 of 65 frames shown]
[frame 21/65]
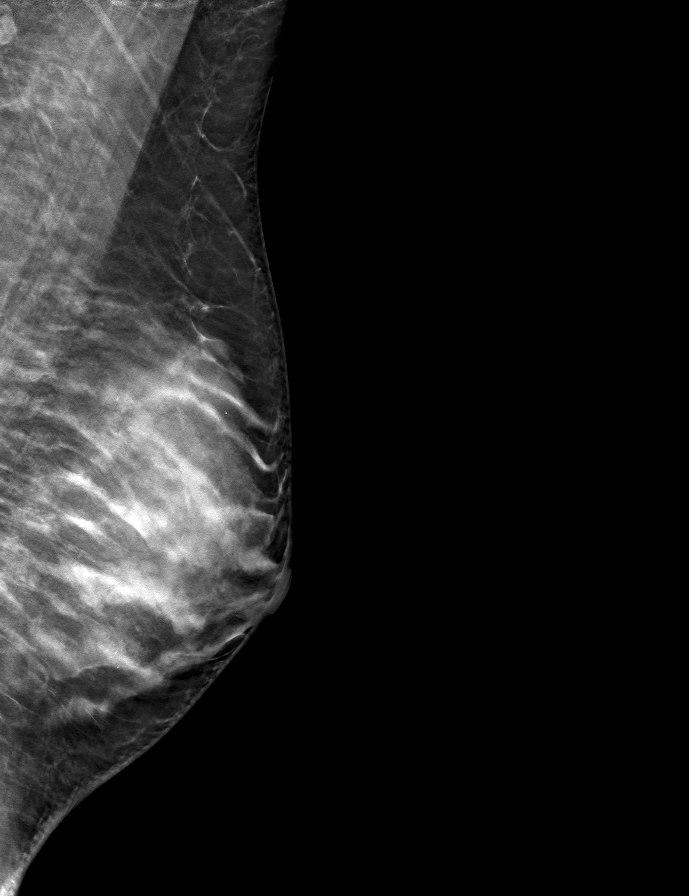
[frame 33/65]
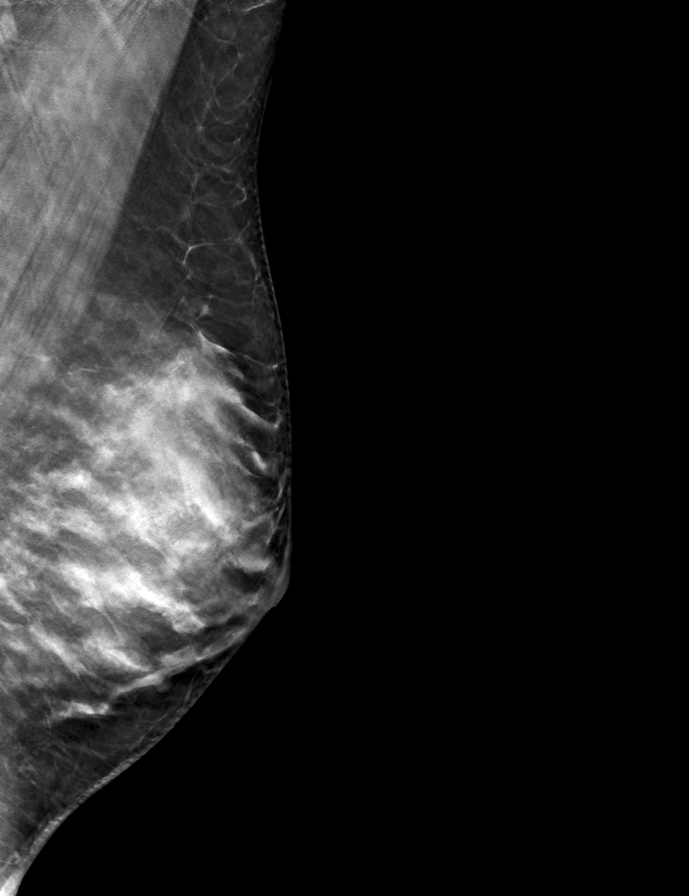

[R CC tomo · tomo slice 33/66.0]
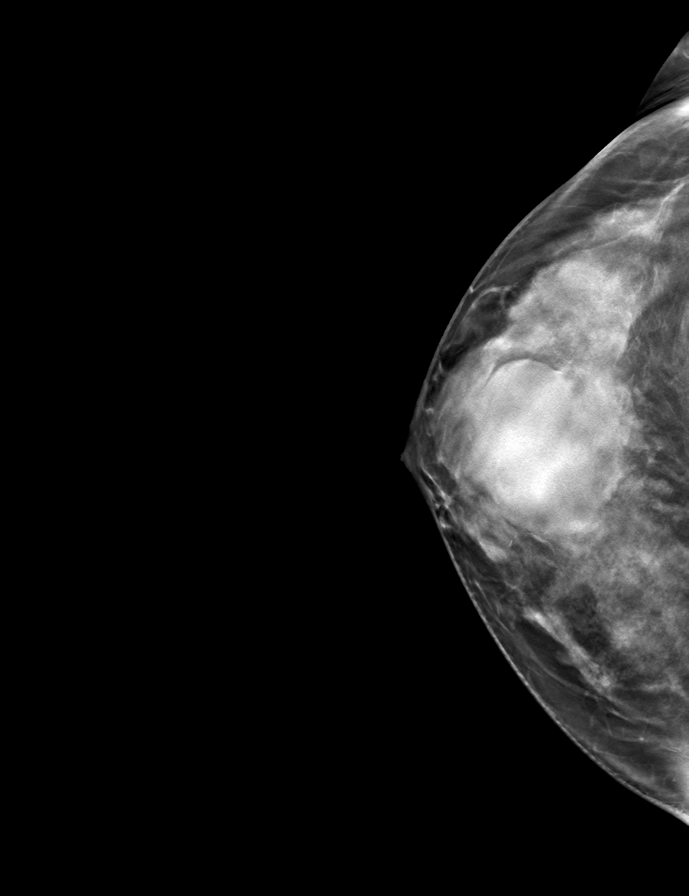

[R MLO tomo · tomo slice 31/62.0]
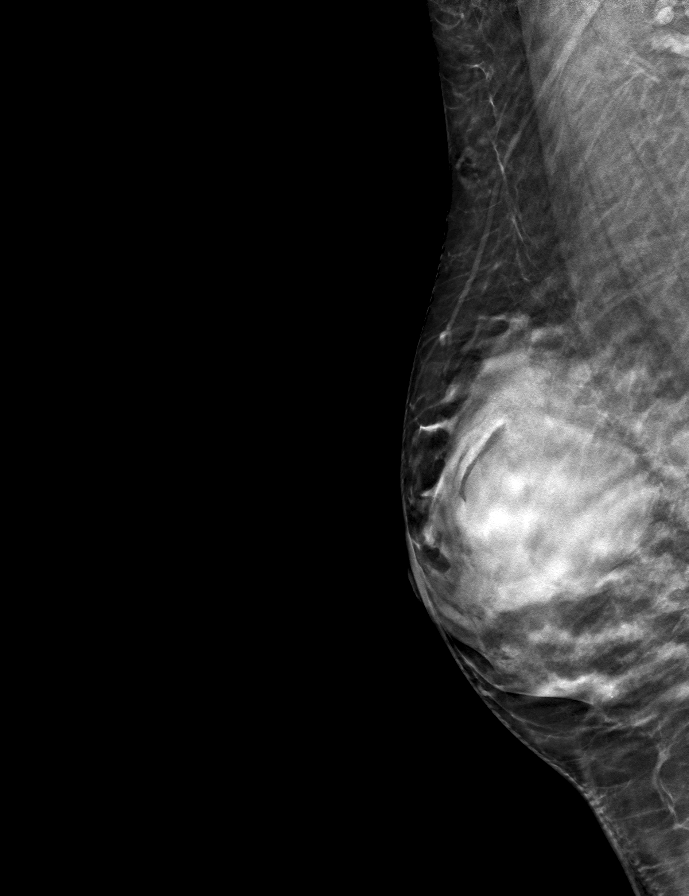

[L CC tomo · tomo slice 37/73.0]
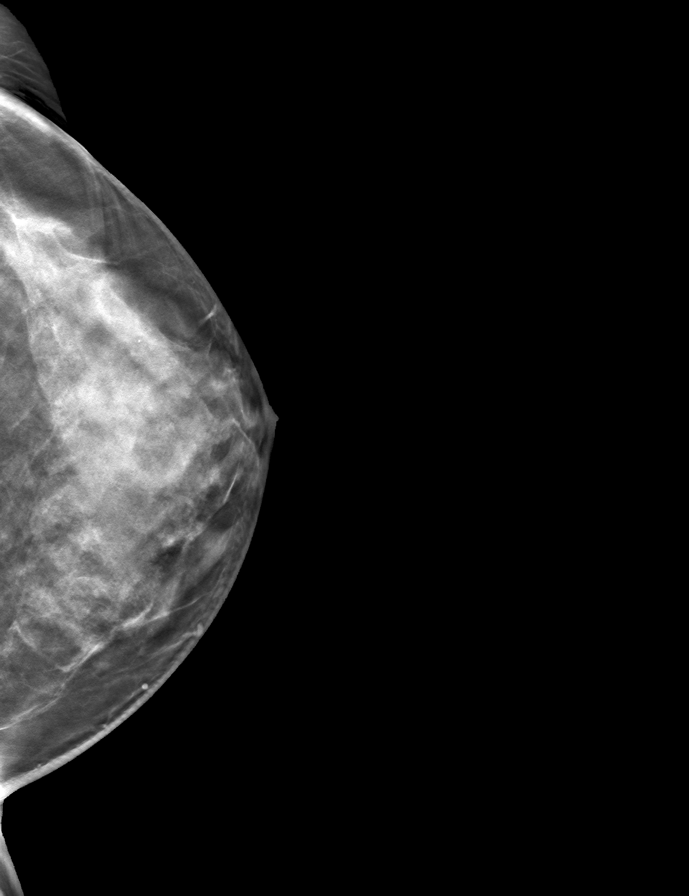

[9 of 24 positions shown; findings below may reference images not displayed]

ACR Breast Density Category c: The breast tissue is heterogeneously
dense, which may obscure small masses.
FINDINGS: There are no findings suspicious for malignancy. Images were
processed with CAD.
IMPRESSION: No mammographic evidence of malignancy. A result letter of this
screening mammogram will be mailed directly to the patient.

RECOMMENDATION:
Screening mammogram in one year. (Code:[id number])

BI-RADS CATEGORY  1: Negative.
